# Patient Record
Sex: Male | Born: 1940 | Race: White | Hispanic: No | Marital: Single | Smoking: Smoker, current status unknown
Health system: Southern US, Community
[De-identification: ages and names within clinical notes are randomized; demographics above are authoritative.]

## PROBLEM LIST (undated history)

## (undated) DIAGNOSIS — S0990XA Unspecified injury of head, initial encounter: Secondary | ICD-10-CM

## (undated) DIAGNOSIS — F0390 Unspecified dementia without behavioral disturbance: Secondary | ICD-10-CM

## (undated) DIAGNOSIS — G219 Secondary parkinsonism, unspecified: Secondary | ICD-10-CM

## (undated) HISTORY — DX: Unspecified injury of head, initial encounter: S09.90XA

## (undated) HISTORY — DX: Unspecified dementia, unspecified severity, without behavioral disturbance, psychotic disturbance, mood disturbance, and anxiety: F03.90

## (undated) HISTORY — DX: Secondary parkinsonism, unspecified: G21.9

---

## 2000-08-27 ENCOUNTER — Inpatient Hospital Stay (HOSPITAL_COMMUNITY): Admission: EM | Admit: 2000-08-27 | Discharge: 2000-09-02 | Payer: Self-pay | Admitting: *Deleted

## 2000-08-27 ENCOUNTER — Encounter: Payer: Self-pay | Admitting: *Deleted

## 2001-10-21 ENCOUNTER — Ambulatory Visit (HOSPITAL_COMMUNITY): Admission: RE | Admit: 2001-10-21 | Discharge: 2001-10-21 | Payer: Self-pay | Admitting: Internal Medicine

## 2001-10-21 ENCOUNTER — Encounter: Payer: Self-pay | Admitting: Internal Medicine

## 2002-01-23 ENCOUNTER — Emergency Department (HOSPITAL_COMMUNITY): Admission: EM | Admit: 2002-01-23 | Discharge: 2002-01-23 | Payer: Self-pay | Admitting: Emergency Medicine

## 2002-01-23 ENCOUNTER — Encounter: Payer: Self-pay | Admitting: Emergency Medicine

## 2002-05-07 ENCOUNTER — Encounter: Payer: Self-pay | Admitting: Internal Medicine

## 2002-05-07 ENCOUNTER — Ambulatory Visit (HOSPITAL_COMMUNITY): Admission: RE | Admit: 2002-05-07 | Discharge: 2002-05-07 | Payer: Self-pay | Admitting: Internal Medicine

## 2002-08-24 ENCOUNTER — Ambulatory Visit (HOSPITAL_COMMUNITY): Admission: RE | Admit: 2002-08-24 | Discharge: 2002-08-24 | Payer: Self-pay | Admitting: Internal Medicine

## 2002-08-24 ENCOUNTER — Encounter: Payer: Self-pay | Admitting: Internal Medicine

## 2002-12-18 ENCOUNTER — Inpatient Hospital Stay: Admission: AD | Admit: 2002-12-18 | Discharge: 2009-06-09 | Payer: Self-pay | Admitting: Internal Medicine

## 2006-05-02 ENCOUNTER — Ambulatory Visit (HOSPITAL_COMMUNITY): Admission: RE | Admit: 2006-05-02 | Discharge: 2006-05-02 | Payer: Self-pay | Admitting: Internal Medicine

## 2006-09-10 ENCOUNTER — Ambulatory Visit (HOSPITAL_COMMUNITY): Admission: RE | Admit: 2006-09-10 | Discharge: 2006-09-10 | Payer: Self-pay | Admitting: Internal Medicine

## 2008-08-20 ENCOUNTER — Ambulatory Visit (HOSPITAL_COMMUNITY): Admission: RE | Admit: 2008-08-20 | Discharge: 2008-08-20 | Payer: Self-pay | Admitting: Internal Medicine

## 2009-06-09 ENCOUNTER — Inpatient Hospital Stay (HOSPITAL_COMMUNITY): Admission: EM | Admit: 2009-06-09 | Discharge: 2009-06-11 | Payer: Self-pay | Admitting: Emergency Medicine

## 2009-06-11 ENCOUNTER — Inpatient Hospital Stay
Admission: AD | Admit: 2009-06-11 | Discharge: 2014-03-28 | Disposition: A | Discharge status: Other Acute Inpatient Hospital | Payer: Medicare Other | Source: Home / Self Care | Attending: Internal Medicine | Admitting: Internal Medicine

## 2009-06-11 DIAGNOSIS — R05 Cough: Secondary | ICD-10-CM

## 2010-04-17 ENCOUNTER — Ambulatory Visit (HOSPITAL_COMMUNITY): Payer: Medicare Other | Attending: Internal Medicine

## 2010-04-17 DIAGNOSIS — M949 Disorder of cartilage, unspecified: Secondary | ICD-10-CM | POA: Insufficient documentation

## 2010-04-17 DIAGNOSIS — M7989 Other specified soft tissue disorders: Secondary | ICD-10-CM | POA: Insufficient documentation

## 2010-04-17 DIAGNOSIS — M899 Disorder of bone, unspecified: Secondary | ICD-10-CM | POA: Insufficient documentation

## 2010-04-17 DIAGNOSIS — M79609 Pain in unspecified limb: Secondary | ICD-10-CM | POA: Insufficient documentation

## 2010-04-18 LAB — BASIC METABOLIC PANEL
BUN: 11 mg/dL (ref 6–23)
Calcium: 8.9 mg/dL (ref 8.4–10.5)
Creatinine, Ser: 0.81 mg/dL (ref 0.4–1.5)
GFR calc non Af Amer: >60 mL/min (ref >60)

## 2010-04-18 LAB — LACTIC ACID, PLASMA: Lactic Acid, Venous: 1.5 mmol/L (ref 0.5–2.2)

## 2010-04-18 LAB — VALPROIC ACID LEVEL: Valproic Acid Lvl: 48 ug/mL — ABNORMAL LOW (ref 50.0–100.0)

## 2010-04-18 LAB — DIFFERENTIAL
Eosinophils Absolute: 0 10*3/uL (ref 0.0–0.7)
Lymphocytes Relative: 6 % — ABNORMAL LOW (ref 12–46)
Lymphs Abs: 0.5 10*3/uL — ABNORMAL LOW (ref 0.7–4.0)
Neutro Abs: 7.3 10*3/uL (ref 1.7–7.7)
Neutrophils Relative %: 85 % — ABNORMAL HIGH (ref 43–77)

## 2010-04-18 LAB — CULTURE, BLOOD (ROUTINE X 2)
Culture: NO GROWTH
Report Status: 5172011
Report Status: 5172011

## 2010-04-18 LAB — CBC
Platelets: 213 10*3/uL (ref 150–400)
WBC: 8.5 10*3/uL (ref 4.0–10.5)

## 2010-06-16 NOTE — Group Therapy Note (Signed)
[name], [name] NO.:  [id number]   MEDICAL RECORD NO.:  [id number]                  PATIENT TYPE:   LOCATION:                                       FACILITY:   PHYSICIAN:  Kingsley Callander. Ouida Sills, M.D.                  DATE OF BIRTH:   DATE OF PROCEDURE:  06/22/2003  DATE OF DISCHARGE:                                   PROGRESS NOTE   [name] continues to exhibit a marked tilting of his head to the right.  He  coughs intermittently, but has generally been able to eat well without  definite aspiration.   GENERAL:  He appears comfortable.  NEUROLOGIC:  Status is stable.  LUNGS:  Clear.  HEART:  Regular.  EXTREMITIES:  No edema.   IMPRESSION:  Status post closed head injury - Continue supportive care.  He  has been stable from a psychiatric standpoint on his current medication  regimen.  Baclofen has been tapered off.      ___________________________________________                                            Kingsley Callander. Ouida Sills, M.D.   ROF/MEDQ  D:  07/07/2003  T:  07/07/2003  Job:  [id number]

## 2010-06-16 NOTE — Group Therapy Note (Signed)
[name], [name]                ACCOUNT NO.:  [id number]   MEDICAL RECORD NO.:  [id number]          PATIENT TYPE:  ORB   LOCATION:  S140                          FACILITY:  [facility]   PHYSICIAN:  Kingsley Callander. Ouida Sills, MD       DATE OF BIRTH:  08-Nov-1940   DATE OF PROCEDURE:  05/21/2004  DATE OF DISCHARGE:                                   PROGRESS NOTE   NURSING HOME PROGRESS NOTE:  [name] overall status has remained fairly  stable.  He denies any increased problems with swallowing or coughing.  He  is breathing comfortably.   PHYSICAL EXAMINATION:  CHEST:  Clear.  HEART:  Regular.  NEURO:  Unchanged.   IMPRESSION:  Status post closed head injury.  Continue current medications.      ROF/MEDQ  D:  05/22/2004  T:  05/22/2004  Job:  [id number]

## 2010-06-16 NOTE — Group Therapy Note (Signed)
NAMEIVAL, PACER NO.:  [id number]   MEDICAL RECORD NO.:  [id number]                  PATIENT TYPE:   LOCATION:                                       FACILITY:   PHYSICIAN:  Kingsley Callander. Ouida Sills, M.D.                  DATE OF BIRTH:   DATE OF PROCEDURE:  04/27/2003  DATE OF DISCHARGE:                                   PROGRESS NOTE   [name] has had no major problems over the past month.  His cough is not  increasing.  His breathing has been stable.  His neck contracture is  unchanged.  Baclofen did not seem to help and has been tapered off.   PHYSICAL EXAMINATION:  Unchanged.   IMPRESSION:  Closed head injury.  The pharmacy consultant has advised  modification of his Depakote formulation.  This would seem to be fine and  will be changed.      ___________________________________________                                            Kingsley Callander. Ouida Sills, M.D.   ROF/MEDQ  D:  05/09/2003  T:  05/10/2003  Job:  [id number]

## 2010-06-16 NOTE — Group Therapy Note (Signed)
NAME:  [name], [name] NO.:  [id number]   MEDICAL RECORD NO.:  [id number]                  PATIENT TYPE:   LOCATION:                                       FACILITY:   PHYSICIAN:  Kingsley Callander. Ouida Sills, M.D.                  DATE OF BIRTH:   DATE OF PROCEDURE:  DATE OF DISCHARGE:                                   PROGRESS NOTE   [name] was seen on May 24.  This is a redictation.  Evidently, the  initial note is not able to be found.  Over the past month, [name] has had no  complications.  He continues to experience intermittent coughing.  He has  been able to swallow though, reasonably well.   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  His oropharynx is moist.  No thyromegaly.  LUNGS:  Clear.  HEART:  Regular with no murmurs.  ABDOMEN:  Nontender.  NEUROLOGICAL:  Unchanged.   IMPRESSION:  Closed head injury.   PLAN:  No medication changes at this point.  Continue Lexapro, Depakote and  Risperdal.      ___________________________________________                                            Kingsley Callander. Ouida Sills, M.D.   ROF/MEDQ  D:  08/05/2003  T:  08/05/2003  Job:  [id number]

## 2010-06-16 NOTE — Group Therapy Note (Signed)
NAMEKLYE, BESECKER NO.:  [id number]   MEDICAL RECORD NO.:  [id number]                  PATIENT TYPE:   LOCATION:                                       FACILITY:   PHYSICIAN:  Kingsley Callander. Ouida Sills, M.D.                  DATE OF BIRTH:   DATE OF PROCEDURE:  03/01/2003  DATE OF DISCHARGE:                                   PROGRESS NOTE   SUBJECTIVE:  [name] has had no problems over the past month.  He is not having  any increased difficulty with cough or swallowing reported.  He again has a  great deal of spasticity which does not really appear to have responded to  baclofen.   OBJECTIVE:  He has a marked tilting of his head to the right.  He is  breathing comfortable.  Chest is clear.  Heart is regular with no murmurs.  Neurologic at baseline.   ASSESSMENT:  1. Status post closed-head injury, stable.  2. History of depression.   PLAN:  Continue Lexapro, Depakote and Risperdal.      ___________________________________________                                            Kingsley Callander. Ouida Sills, M.D.   ROF/MEDQ  D:  03/04/2003  T:  03/04/2003  Job:  [id number]
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
## 2010-06-16 NOTE — Group Therapy Note (Signed)
[name], [name]                ACCOUNT NO.:  [id number]   MEDICAL RECORD NO.:  [id number]          PATIENT TYPE:  ORB   LOCATION:  S140                          FACILITY:  [facility]   PHYSICIAN:  Kingsley Callander. Ouida Sills, MD       DATE OF BIRTH:  06/27/1940   DATE OF PROCEDURE:  DATE OF DISCHARGE:                                 PROGRESS NOTE   [name] was seen today while he was having his breakfast.  As long as he  concentrates on eating and swallowing, he does not have much trouble  with choking and coughing.  If he tries to talk, he will often become  choked.  His head positioning remains a problem and is essentially  unchanged, despite physical therapy efforts has been very difficult to  achieve significant improvement in his head positioning   He is maintaining his weight and nutritional status.   He has been stable from a behavioral health standpoint.  We will  continue his current medical regimen without any medication changes  today.      Kingsley Callander. Ouida Sills, MD  Electronically Signed     ROF/MEDQ  D:  10/14/2007  T:  10/14/2007  Job:  [id number]

## 2010-06-16 NOTE — H&P (Signed)
[name], [name]                ACCOUNT NO.:  [id number]   MEDICAL RECORD NO.:  [id number]          PATIENT TYPE:  ORB   LOCATION:  S140                          FACILITY:  [facility]   PHYSICIAN:  Kingsley Callander. Ouida Sills, MD       DATE OF BIRTH:  1940-08-01   DATE OF ADMISSION:  03/26/2007  DATE OF DISCHARGE:  LH                              HISTORY & PHYSICAL   HISTORY OF PRESENT ILLNESS:  This patient is a 70 year old white male  with a history of a closed head injury in 1965, who has required long-  term for years.  He has had no significant overall changes in his health  over the past year.  He has been treated for depression with Celexa,  Depakote and Risperdal.  He previously had suicidal ideations, which  have now resolved.  He has previously had a VP shunt placed.  His  mobility has been limited.  He is in a wheelchair.   PAST MEDICAL HISTORY:  1. Closed head injury.  2. Depression.  3. Pneumonia.  4. Urinary tract infection.  5. VP shunt.   MEDICATIONS:  1. Colace 100 mg daily.  2. Centrum daily.  3. Celexa 30 mg daily.  4. Risperdal 2 mg b.i.d.  5. Depakote 125 mg capsules, 4 to be sprinkled in applesauce q. 12.   ALLERGIES:  PENICILLIN.   SOCIAL HISTORY:  He does not use alcohol or tobacco.   REVIEW OF SYSTEMS:  In the past he has had some difficulty with  swallowing and subsequent coughing.  He denies any increased problems  with these now.  He is not having any difficulty with breathing, chest  or abdominal pain.   PHYSICAL EXAMINATION:  Recent vital signs have been normal.  GENERAL:  Alert and sitting upright.  HEENT:  No scleral icterus.  Pharynx unremarkable.  NECK:  No JVD, thyromegaly or lymphadenopathy.  LUNGS:  Clear.  HEART:  Regular with no murmurs or gallops.  ABDOMEN:  Nontender.  No hepatosplenomegaly.  EXTREMITIES:  He has limited use of upper or lower extremities.  No  cyanosis, clubbing or edema.  NEUROLOGIC:  He has contracture of his head to the  right.  As stated, he  has limited motion in the extremities.  He has difficulty with  expressive aphasia.  SKIN:  Intact.   LABORATORY DATA:  Depakote level 61 August 2008.  He had a swallowing  study April 2008, which revealed a definite swallowing dysfunction, with  laryngeal penetration and aspiration.   IMPRESSION:  1. Closed head injury.  2. Depression.  Continue Celexa, Depakote and Risperdal.  3. Dysphagia.  This has been discussed with the family previously.      Oral feedings will be continued.  The plan will not be to utilize tube feeding.  1. History of pneumonia.  2. History of urinary tract infections.      Kingsley Callander. Ouida Sills, MD  Electronically Signed     ROF/MEDQ  D:  03/26/2007  T:  03/26/2007  Job:  [id number]

## 2010-06-16 NOTE — Group Therapy Note (Signed)
NAMEBRICESON, BROADWATER                ACCOUNT NO.:  [id number]   MEDICAL RECORD NO.:  [id number]          PATIENT TYPE:  ORB   LOCATION:  S140                          FACILITY:  [facility]   PHYSICIAN:  Kingsley Callander. Ouida Sills, MD       DATE OF BIRTH:  11/28/1940   DATE OF PROCEDURE:  08/25/2004  DATE OF DISCHARGE:                                   PROGRESS NOTE   HISTORY OF PRESENT ILLNESS:  [name] has had a fairly stable course recently.  He has had no signs of worsening coughing or increased difficulty with his  swallowing.  His behavior has been fairly well controlled.  He appears in  his baseline state.  Lungs are clear.  His heart rate is regular with no  murmurs.  Abdomen is nontender.  He has no peripheral edema.  His  musculoskeletal status is unchanged.   MEDICATIONS:  1.  Colace.  2.  Theragran.  3.  Celexa.  4.  Risperdal.  5.  Depakote.  6.  Levsin.   IMPRESSION:  Closed head injury, stable.  No medication changes at this  time.       ROF/MEDQ  D:  09/04/2004  T:  09/04/2004  Job:  [id number]

## 2010-06-16 NOTE — Group Therapy Note (Signed)
[name], LUTZE NO.:  [id number]   MEDICAL RECORD NO.:  [id number]                  PATIENT TYPE:   LOCATION:                                       FACILITY:   PHYSICIAN:  Kingsley Callander. Ouida Sills, M.D.                  DATE OF BIRTH:   DATE OF PROCEDURE:  05/29/2003  DATE OF DISCHARGE:                                   PROGRESS NOTE   HISTORY OF PRESENT ILLNESS:  [name] has had no changes in his neurologic status  over the past month.  Despite physical therapy efforts, his spasticity with  his neck is minimally changed.  He seems to be swallowing well and breathing  well now.  He does cough frequently.  Lungs clear.  Heart regular.  No  peripheral edema.  Neurologically stable.   IMPRESSION:  Status post closed head injury.   PLAN:  Continue current medications.      ___________________________________________                                            Kingsley Callander. Ouida Sills, M.D.   ROF/MEDQ  D:  07/07/2003  T:  07/07/2003  Job:  [id number]

## 2011-02-08 ENCOUNTER — Ambulatory Visit (HOSPITAL_COMMUNITY)
Admit: 2011-02-08 | Discharge: 2011-02-08 | Disposition: A | Discharge status: Skilled Nursing Facility | Payer: Medicare Other | Source: Skilled Nursing Facility | Attending: Internal Medicine | Admitting: Internal Medicine

## 2011-02-08 DIAGNOSIS — R05 Cough: Secondary | ICD-10-CM | POA: Insufficient documentation

## 2011-02-08 DIAGNOSIS — R059 Cough, unspecified: Secondary | ICD-10-CM | POA: Insufficient documentation

## 2011-02-08 DIAGNOSIS — R0602 Shortness of breath: Secondary | ICD-10-CM | POA: Insufficient documentation

## 2011-02-12 NOTE — Progress Notes (Signed)
NAMEJACQUEL, MCCAMISH NO.:  [id number]  MEDICAL RECORD NO.:  [id number]  LOCATION:                                 FACILITY:  PHYSICIAN:  Kingsley Callander. Ouida Sills, MD       DATE OF BIRTH:  Aug 22, 1940  DATE OF PROCEDURE: DATE OF DISCHARGE:                                PROGRESS NOTE   [name] developed a fever over 101 yesterday.  He had cough and runny nose.  Chest x-ray revealed atelectasis in the right lung, but no evidence of infiltrate consistent with pneumonia.  His roommate currently has pneumonia.  Influenza studies were negative.  HEENT:  Pharynx is moist. LUNGS:  Clear. HEART:  Regular with no murmurs. ABDOMEN:  Nontender.  IMPRESSION/PLAN:  Upper respiratory infection with no definite evidence of pneumonia at this point.  He will be treated with Levaquin 500 mg daily for 1 week.     Kingsley Callander. Ouida Sills, MD     ROF/MEDQ  D:  02/09/2011  T:  02/09/2011  Job:  [id number]

## 2011-03-18 DIAGNOSIS — S0990XA Unspecified injury of head, initial encounter: Secondary | ICD-10-CM | POA: Diagnosis present

## 2011-04-30 ENCOUNTER — Ambulatory Visit (HOSPITAL_COMMUNITY): Payer: Medicare Other | Attending: Internal Medicine

## 2011-04-30 DIAGNOSIS — M4120 Other idiopathic scoliosis, site unspecified: Secondary | ICD-10-CM | POA: Insufficient documentation

## 2011-04-30 DIAGNOSIS — R0989 Other specified symptoms and signs involving the circulatory and respiratory systems: Secondary | ICD-10-CM | POA: Insufficient documentation

## 2011-07-13 NOTE — Progress Notes (Signed)
NAMERENNIE, ROUCH NO.:  [id number]  MEDICAL RECORD NO.:  [id number]  LOCATION:                                 FACILITY:  PHYSICIAN:  Ronnie Callander. Ouida Sills, MD       DATE OF BIRTH:  May 20, 1940  DATE OF PROCEDURE:  07/13/2011 DATE OF DISCHARGE:                                PROGRESS NOTE   SUBJECTIVE:  [name] has been noticed by the nursing staff to have drooping of the right side of his face.  He has otherwise had no observed neurological changes.  OBJECTIVE:  HEENT:  He was found to have loss of his nasolabial fold on the right side.  He had difficulty fully closing his right eye.  He has been able to swallow without changes.  He is able to protrude his tongue. EXTREMITIES:  There have been no changes in his upper or lower extremities.  IMPRESSION/PLAN:  Right Bell palsy.  He will be treated with a course of acyclovir and a prednisone taper.  His eye will be patched at night and lubricating ointment will be used throughout the day.     Ronnie Callander. Ouida Sills, MD     ROF/MEDQ  D:  07/13/2011  T:  07/13/2011  Job:  [id number]

## 2011-08-13 NOTE — Progress Notes (Unsigned)
[name], [name] NO.:  [id number]  MEDICAL RECORD NO.:  [id number]  LOCATION:                                 FACILITY:  PHYSICIAN:  Kingsley Callander. Ouida Sills, MD       DATE OF BIRTH:  11/27/1940  DATE OF PROCEDURE:  08/12/2011 DATE OF DISCHARGE:                                PROGRESS NOTE   [name] was seen for re-evaluation of his right Bell's palsy.  He has been treated with acyclovir and prednisone.  His right facial weakness appears to be improving.  He has less of a droop of his right cheek and lip.  He still has some difficulty fully closing his right eye.  He has otherwise remained unchanged neurologically.  IMPRESSIONS/PLAN:  Not Bell's palsy.  Continue observation.  Hopefully, his right facial changes will completely resolve in the near future.     Kingsley Callander. Ouida Sills, MD     ROF/MEDQ  D:  08/12/2011  T:  08/12/2011  Job:  [id number]

## 2012-04-10 NOTE — Progress Notes (Unsigned)
NAMEALYAAN, BUDZYNSKI NO.:  [id number]  MEDICAL RECORD NO.:  [id number]  LOCATION:                                 FACILITY:  PHYSICIAN:  Kingsley Callander. Ouida Sills, MD       DATE OF BIRTH:  1940/05/05  DATE OF PROCEDURE:  03/24/2012 DATE OF DISCHARGE:                                PROGRESS NOTE   HISTORY:  [name] developed an abnormality with his left thumb.  The nurse called about redness and possible infection.  On exam, he was found to have an abscess on the end of his left thumb after a Betadine prep and ethyl chloride cooling.  This was incised and drained of pus.  A dressing was applied.  The nursing staff will continue with dressing changes.  He will be treated with a course of antibiotic therapy for 10 days.     Kingsley Callander. Ouida Sills, MD     ROF/MEDQ  D:  04/09/2012  T:  04/10/2012  Job:  [id number]

## 2012-06-16 NOTE — Progress Notes (Unsigned)
NAMEISSACC, MERLO NO.:  [id number]  MEDICAL RECORD NO.:  [id number]  LOCATION:                                 FACILITY:  PHYSICIAN:  Kingsley Callander. Ouida Sills, MD       DATE OF BIRTH:  04/22/1940  DATE OF PROCEDURE:  06/12/2012 DATE OF DISCHARGE:                                PROGRESS NOTE   __________ developed another small abscess on the tip of his left thumb. There was visible pus under the skin measuring approximately 0.5 cm in diameter.  This was prepped with Betadine and then incised and drained with a #15 blade.  This wound will be treated with soaks b.i.d. and b.i.d. dressing changes.  His condition has otherwise been stable.  No medication changes have been made.     Kingsley Callander. Ouida Sills, MD     ROF/MEDQ  D:  06/13/2012  T:  06/13/2012  Job:  [id number]

## 2012-08-19 ENCOUNTER — Ambulatory Visit (HOSPITAL_COMMUNITY)
Admission: AD | Admit: 2012-08-19 | Discharge: 2012-08-19 | Disposition: A | Discharge status: Home / Self Care | Payer: Medicare Other | Attending: Family Medicine | Admitting: Family Medicine

## 2012-08-19 ENCOUNTER — Ambulatory Visit (HOSPITAL_COMMUNITY): Payer: Medicare Other | Attending: Internal Medicine

## 2012-08-19 DIAGNOSIS — R062 Wheezing: Secondary | ICD-10-CM | POA: Insufficient documentation

## 2012-08-19 DIAGNOSIS — M4120 Other idiopathic scoliosis, site unspecified: Secondary | ICD-10-CM | POA: Insufficient documentation

## 2012-08-19 DIAGNOSIS — R059 Cough, unspecified: Secondary | ICD-10-CM | POA: Insufficient documentation

## 2012-08-19 DIAGNOSIS — R05 Cough: Secondary | ICD-10-CM | POA: Insufficient documentation

## 2012-08-19 DIAGNOSIS — M4000 Postural kyphosis, site unspecified: Secondary | ICD-10-CM | POA: Insufficient documentation

## 2012-09-11 ENCOUNTER — Ambulatory Visit (HOSPITAL_COMMUNITY)
Admit: 2012-09-11 | Discharge: 2012-09-11 | Disposition: A | Discharge status: Home / Self Care | Payer: Medicare Other | Attending: Internal Medicine | Admitting: Internal Medicine

## 2012-09-11 ENCOUNTER — Ambulatory Visit (HOSPITAL_COMMUNITY)
Admit: 2012-09-11 | Discharge: 2012-09-11 | Disposition: A | Discharge status: Home / Self Care | Payer: Medicare Other | Source: Ambulatory Visit | Attending: Internal Medicine | Admitting: Internal Medicine

## 2012-09-11 DIAGNOSIS — R1310 Dysphagia, unspecified: Secondary | ICD-10-CM | POA: Insufficient documentation

## 2012-09-11 DIAGNOSIS — R1313 Dysphagia, pharyngeal phase: Secondary | ICD-10-CM | POA: Insufficient documentation

## 2012-09-11 DIAGNOSIS — IMO0001 Reserved for inherently not codable concepts without codable children: Secondary | ICD-10-CM | POA: Insufficient documentation
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
## 2013-04-07 ENCOUNTER — Encounter: Payer: Self-pay | Admitting: *Deleted

## 2013-04-28 NOTE — Progress Notes (Unsigned)
NAMLazarus Salines:  [name], [name]                ACCOUNT NO.:  [id number]  MEDICAL RECORD NO.:  [id number]  LOCATION:                                 FACILITY:  PHYSICIAN:  Kingsley Callanderoy O. Ouida SillsFagan, MD       DATE OF BIRTH:  [birth year]  DATE OF PROCEDURE:  04/22/2013 DATE OF DISCHARGE:                                PROGRESS NOTE   SUBJECTIVE:  [name] is alert and sitting up in his wheelchair.  He is swallowing reasonably well.  He continues to experience intermittent cough.  He has been stable from a neurologic standpoint.  OBJECTIVE:  LUNGS:  Clear. HEART:  Regular with no murmurs. EXTREMITIES:  No edema.  IMPRESSION/PLAN:  Closed head injury.  He has mobility limitations that impairs ability to walk.  Use a walker or crutches.  He has to have a caregiver propel him in a wheelchair due to his quadriplegia.  An order has been placed for a new wheelchair.  Psychological status has been stable.  He continues citalopram, risperidone, and Depakote.     Kingsley Callanderoy O. Ouida SillsFagan, MD     ROF/MEDQ  D:  04/24/2013  T:  04/24/2013  Job:  [id number]

## 2013-05-31 ENCOUNTER — Inpatient Hospital Stay (HOSPITAL_COMMUNITY): Payer: Medicare Other

## 2013-05-31 ENCOUNTER — Ambulatory Visit (HOSPITAL_COMMUNITY): Payer: Medicare Other | Attending: Internal Medicine

## 2013-05-31 DIAGNOSIS — R0602 Shortness of breath: Secondary | ICD-10-CM | POA: Insufficient documentation

## 2013-06-29 ENCOUNTER — Ambulatory Visit (HOSPITAL_COMMUNITY): Payer: Medicare Other | Attending: Internal Medicine

## 2013-06-29 DIAGNOSIS — M7989 Other specified soft tissue disorders: Secondary | ICD-10-CM | POA: Insufficient documentation

## 2013-06-29 DIAGNOSIS — M79609 Pain in unspecified limb: Secondary | ICD-10-CM | POA: Insufficient documentation

## 2013-09-23 NOTE — Progress Notes (Unsigned)
NAMEJASTIN, FORE NO.:  [id number]  MEDICAL RECORD NO.:  [id number]  LOCATION:                                 FACILITY:  PHYSICIAN:  Kingsley Callander. Ouida Sills, MD            DATE OF BIRTH:  DATE OF PROCEDURE:  09/18/2013 DATE OF DISCHARGE:                                PROGRESS NOTE   SUBJECTIVE:  [name] was rechecked at the nursing home.  He has had no further problems with his thumb since his wire has been removed.  He has had modification of his psychiatric medications.  The nurses report no problems from this change.  OBJECTIVE:  GENERAL:  He is alert and comfortable appearing. HEAD AND NECK:  There is no change with his head and neck. LUNGS:  Clear. HEART:  Regular with no murmurs.  ABDOMEN:  Soft and nontender with no palpable organomegaly. EXTREMITIES:  Reveal no edema. NEURO:  Unchanged.  IMPRESSION/PLAN:  Closed head injury.  His risperidone dose has been gradually tapered.  He has tolerated this well.  We will continue to observe further since his most recent dosage reduction.  Depakote is unchanged.     Kingsley Callander. Ouida Sills, MD     ROF/MEDQ  D:  09/23/2013  T:  09/23/2013  Job:  [id number]

## 2013-11-24 ENCOUNTER — Encounter: Payer: Self-pay | Admitting: *Deleted

## 2013-12-13 ENCOUNTER — Inpatient Hospital Stay (HOSPITAL_COMMUNITY): Payer: Medicare Other | Attending: Internal Medicine

## 2014-03-28 ENCOUNTER — Emergency Department (HOSPITAL_COMMUNITY): Payer: Medicare Other

## 2014-03-28 ENCOUNTER — Encounter (HOSPITAL_COMMUNITY): Payer: Self-pay | Admitting: Emergency Medicine

## 2014-03-28 ENCOUNTER — Inpatient Hospital Stay (HOSPITAL_COMMUNITY)
Admission: EM | Admit: 2014-03-28 | Discharge: 2014-04-30 | DRG: 871 | Disposition: E | Payer: Medicare Other | Attending: Internal Medicine | Admitting: Internal Medicine

## 2014-03-28 DIAGNOSIS — D649 Anemia, unspecified: Secondary | ICD-10-CM | POA: Diagnosis present

## 2014-03-28 DIAGNOSIS — Z87820 Personal history of traumatic brain injury: Secondary | ICD-10-CM

## 2014-03-28 DIAGNOSIS — F0390 Unspecified dementia without behavioral disturbance: Secondary | ICD-10-CM | POA: Diagnosis present

## 2014-03-28 DIAGNOSIS — G20 Parkinson's disease: Secondary | ICD-10-CM | POA: Diagnosis present

## 2014-03-28 DIAGNOSIS — Z515 Encounter for palliative care: Secondary | ICD-10-CM

## 2014-03-28 DIAGNOSIS — Z880 Allergy status to penicillin: Secondary | ICD-10-CM | POA: Diagnosis not present

## 2014-03-28 DIAGNOSIS — J9691 Respiratory failure, unspecified with hypoxia: Secondary | ICD-10-CM | POA: Diagnosis present

## 2014-03-28 DIAGNOSIS — A419 Sepsis, unspecified organism: Secondary | ICD-10-CM | POA: Diagnosis present

## 2014-03-28 DIAGNOSIS — R0682 Tachypnea, not elsewhere classified: Secondary | ICD-10-CM

## 2014-03-28 DIAGNOSIS — F17200 Nicotine dependence, unspecified, uncomplicated: Secondary | ICD-10-CM | POA: Diagnosis present

## 2014-03-28 DIAGNOSIS — N390 Urinary tract infection, site not specified: Secondary | ICD-10-CM | POA: Diagnosis present

## 2014-03-28 DIAGNOSIS — Z6823 Body mass index (BMI) 23.0-23.9, adult: Secondary | ICD-10-CM | POA: Diagnosis not present

## 2014-03-28 DIAGNOSIS — R7989 Other specified abnormal findings of blood chemistry: Secondary | ICD-10-CM

## 2014-03-28 DIAGNOSIS — E46 Unspecified protein-calorie malnutrition: Secondary | ICD-10-CM | POA: Diagnosis present

## 2014-03-28 DIAGNOSIS — R0602 Shortness of breath: Secondary | ICD-10-CM

## 2014-03-28 DIAGNOSIS — J189 Pneumonia, unspecified organism: Secondary | ICD-10-CM | POA: Diagnosis present

## 2014-03-28 DIAGNOSIS — Y95 Nosocomial condition: Secondary | ICD-10-CM | POA: Diagnosis present

## 2014-03-28 DIAGNOSIS — Z66 Do not resuscitate: Secondary | ICD-10-CM | POA: Diagnosis present

## 2014-03-28 LAB — URINALYSIS, ROUTINE W REFLEX MICROSCOPIC
Glucose, UA: NEGATIVE mg/dL
Ketones, ur: 15 mg/dL — AB
Nitrite: POSITIVE — AB
Protein, ur: 30 mg/dL — AB
Specific Gravity, Urine: >1.03 — ABNORMAL HIGH (ref 1.005–1.030)
Urobilinogen, UA: 1 mg/dL (ref 0.0–1.0)
pH: 5.5 (ref 5.0–8.0)

## 2014-03-28 LAB — COMPREHENSIVE METABOLIC PANEL WITH GFR
ALT: 19 U/L (ref 0–53)
AST: 28 U/L (ref 0–37)
Albumin: 2.4 g/dL — ABNORMAL LOW (ref 3.5–5.2)
Alkaline Phosphatase: 68 U/L (ref 39–117)
Anion gap: 4 — ABNORMAL LOW (ref 5–15)
BUN: 38 mg/dL — ABNORMAL HIGH (ref 6–23)
CO2: 25 mmol/L (ref 19–32)
Calcium: 8.2 mg/dL — ABNORMAL LOW (ref 8.4–10.5)
Chloride: 111 mmol/L (ref 96–112)
Creatinine, Ser: 0.94 mg/dL (ref 0.50–1.35)
GFR calc Af Amer: >90 mL/min
GFR calc non Af Amer: 82 mL/min — ABNORMAL LOW
Glucose, Bld: 121 mg/dL — ABNORMAL HIGH (ref 70–99)
Potassium: 4.4 mmol/L (ref 3.5–5.1)
Sodium: 140 mmol/L (ref 135–145)
Total Bilirubin: 0.4 mg/dL (ref 0.3–1.2)
Total Protein: 8 g/dL (ref 6.0–8.3)

## 2014-03-28 LAB — CBC WITH DIFFERENTIAL/PLATELET
Basophils Absolute: 0 K/uL (ref 0.0–0.1)
Basophils Relative: 0 % (ref 0–1)
Eosinophils Absolute: 0 K/uL (ref 0.0–0.7)
Eosinophils Relative: 0 % (ref 0–5)
HCT: 36.3 % — ABNORMAL LOW (ref 39.0–52.0)
Hemoglobin: 11.8 g/dL — ABNORMAL LOW (ref 13.0–17.0)
Lymphocytes Relative: 5 % — ABNORMAL LOW (ref 12–46)
Lymphs Abs: 0.8 K/uL (ref 0.7–4.0)
MCH: 30.6 pg (ref 26.0–34.0)
MCHC: 32.5 g/dL (ref 30.0–36.0)
MCV: 94.3 fL (ref 78.0–100.0)
Monocytes Absolute: 0.9 K/uL (ref 0.1–1.0)
Monocytes Relative: 6 % (ref 3–12)
Neutro Abs: 14.5 K/uL — ABNORMAL HIGH (ref 1.7–7.7)
Neutrophils Relative %: 89 % — ABNORMAL HIGH (ref 43–77)
Platelets: 358 K/uL (ref 150–400)
RBC: 3.85 MIL/uL — ABNORMAL LOW (ref 4.22–5.81)
RDW: 13.8 % (ref 11.5–15.5)
WBC: 16.2 K/uL — ABNORMAL HIGH (ref 4.0–10.5)

## 2014-03-28 LAB — TROPONIN I: Troponin I: <0.03 ng/mL (ref <0.031)

## 2014-03-28 LAB — URINE MICROSCOPIC-ADD ON

## 2014-03-28 LAB — I-STAT CG4 LACTIC ACID, ED: Lactic Acid, Venous: 1.36 mmol/L (ref 0.5–2.0)

## 2014-03-28 LAB — BRAIN NATRIURETIC PEPTIDE: B Natriuretic Peptide: 32 pg/mL (ref 0.0–100.0)

## 2014-03-28 LAB — MRSA PCR SCREENING: MRSA by PCR: NEGATIVE

## 2014-03-28 MED ORDER — SODIUM CHLORIDE 0.9 % IV SOLN
1000.0000 mL | INTRAVENOUS | Status: DC
  Administered 2014-03-28 (×3): 1000 mL via INTRAVENOUS

## 2014-03-28 MED ORDER — SODIUM CHLORIDE 0.9 % IV SOLN
INTRAVENOUS | Status: AC
  Administered 2014-03-28: 05:00:00 via INTRAVENOUS

## 2014-03-28 MED ORDER — VANCOMYCIN HCL IN DEXTROSE 1-5 GM/200ML-% IV SOLN
1000.0000 mg | Freq: Two times a day (BID) | INTRAVENOUS | Status: DC
  Administered 2014-03-28 – 2014-03-29 (×3): 1000 mg via INTRAVENOUS
  Filled 2014-03-28 (×6): qty 200

## 2014-03-28 MED ORDER — LEVOFLOXACIN IN D5W 750 MG/150ML IV SOLN
750.0000 mg | Freq: Once | INTRAVENOUS | Status: AC
  Administered 2014-03-28: 750 mg via INTRAVENOUS
  Filled 2014-03-28: qty 150

## 2014-03-28 MED ORDER — SODIUM CHLORIDE 0.9 % IV SOLN: INTRAVENOUS | Status: AC

## 2014-03-28 MED ORDER — DEXTROSE 5 % IV SOLN
2.0000 g | Freq: Once | INTRAVENOUS | Status: AC
  Administered 2014-03-28: 2 g via INTRAVENOUS
  Filled 2014-03-28: qty 2

## 2014-03-28 MED ORDER — ACETAMINOPHEN 325 MG PO TABS
650.0000 mg | ORAL_TABLET | Freq: Four times a day (QID) | ORAL | Status: DC | PRN
  Administered 2014-03-29: 650 mg via ORAL

## 2014-03-28 MED ORDER — ENOXAPARIN SODIUM 30 MG/0.3ML ~~LOC~~ SOLN
30.0000 mg | SUBCUTANEOUS | Status: DC
  Administered 2014-03-28: 30 mg via SUBCUTANEOUS
  Filled 2014-03-28: qty 0.3

## 2014-03-28 MED ORDER — KETOROLAC TROMETHAMINE 30 MG/ML IJ SOLN
30.0000 mg | Freq: Once | INTRAMUSCULAR | Status: AC
  Administered 2014-03-28: 30 mg via INTRAVENOUS
  Filled 2014-03-28: qty 1

## 2014-03-28 MED ORDER — SODIUM CHLORIDE 0.9 % IV BOLUS (SEPSIS)
1000.0000 mL | Freq: Once | INTRAVENOUS | Status: AC
  Administered 2014-03-28: 1000 mL via INTRAVENOUS

## 2014-03-28 MED ORDER — CETYLPYRIDINIUM CHLORIDE 0.05 % MT LIQD
7.0000 mL | Freq: Two times a day (BID) | OROMUCOSAL | Status: DC
  Administered 2014-03-28 – 2014-03-30 (×5): 7 mL via OROMUCOSAL

## 2014-03-28 MED ORDER — ENOXAPARIN SODIUM 40 MG/0.4ML ~~LOC~~ SOLN
40.0000 mg | SUBCUTANEOUS | Status: DC
  Administered 2014-03-29: 40 mg via SUBCUTANEOUS
  Filled 2014-03-28: qty 0.4

## 2014-03-28 MED ORDER — SODIUM CHLORIDE 0.9 % IV SOLN
1000.0000 mL | Freq: Once | INTRAVENOUS | Status: AC
  Administered 2014-03-28: 1000 mL via INTRAVENOUS

## 2014-03-28 MED ORDER — LEVOFLOXACIN IN D5W 750 MG/150ML IV SOLN
750.0000 mg | INTRAVENOUS | Status: DC
  Administered 2014-03-29 – 2014-03-30 (×2): 750 mg via INTRAVENOUS
  Filled 2014-03-28 (×2): qty 150

## 2014-03-28 MED ORDER — AZTREONAM 1 G IJ SOLR: 1.0000 g | Freq: Once | INTRAMUSCULAR | Status: DC

## 2014-03-28 MED ORDER — RISPERIDONE 0.5 MG PO TABS
0.5000 mg | ORAL_TABLET | Freq: Two times a day (BID) | ORAL | Status: DC
  Administered 2014-03-28 – 2014-03-29 (×2): 0.5 mg via ORAL
  Filled 2014-03-28: qty 0.5
  Filled 2014-03-28: qty 1
  Filled 2014-03-28 (×2): qty 0.5
  Filled 2014-03-28: qty 1

## 2014-03-28 MED ORDER — ACETAMINOPHEN 650 MG RE SUPP
650.0000 mg | Freq: Once | RECTAL | Status: AC
  Administered 2014-03-28: 650 mg via RECTAL
  Filled 2014-03-28: qty 1

## 2014-03-28 MED ORDER — PNEUMOCOCCAL VAC POLYVALENT 25 MCG/0.5ML IJ INJ
0.5000 mL | INJECTION | INTRAMUSCULAR | Status: AC
  Administered 2014-03-29: 0.5 mL via INTRAMUSCULAR
  Filled 2014-03-28: qty 0.5

## 2014-03-28 MED ORDER — VANCOMYCIN HCL IN DEXTROSE 1-5 GM/200ML-% IV SOLN
1000.0000 mg | Freq: Once | INTRAVENOUS | Status: AC
  Administered 2014-03-28: 1000 mg via INTRAVENOUS
  Filled 2014-03-28: qty 200

## 2014-03-28 MED ORDER — VANCOMYCIN HCL IN DEXTROSE 1-5 GM/200ML-% IV SOLN
1000.0000 mg | Freq: Once | INTRAVENOUS | Status: DC
  Filled 2014-03-28: qty 200

## 2014-03-28 MED ORDER — DEXTROSE 5 % IV SOLN
1.0000 g | Freq: Three times a day (TID) | INTRAVENOUS | Status: DC
  Administered 2014-03-28 – 2014-03-29 (×4): 1 g via INTRAVENOUS
  Filled 2014-03-28 (×9): qty 1

## 2014-03-28 MED ORDER — ACETAMINOPHEN 650 MG RE SUPP
650.0000 mg | Freq: Four times a day (QID) | RECTAL | Status: DC | PRN
  Administered 2014-03-28 – 2014-03-30 (×3): 650 mg via RECTAL
  Filled 2014-03-28 (×4): qty 1

## 2014-03-28 MED ORDER — SODIUM CHLORIDE 0.9 % IJ SOLN
3.0000 mL | Freq: Two times a day (BID) | INTRAMUSCULAR | Status: DC
  Administered 2014-03-28 – 2014-03-29 (×3): 3 mL via INTRAVENOUS

## 2014-03-28 NOTE — Progress Notes (Addendum)
ANTIBIOTIC CONSULT NOTE-Preliminary  Pharmacy Consult for Aztreonam, Vancomycin and Levofloxacin Indication: Sepsis  Allergies  Allergen Reactions  . Penicillins     Patient Measurements: Height: 6\' 1"[id number]$  (185.4 cm) Weight: 215 lb (97.523 kg) IBW/kg (Calculated) : 79.9  Vital Signs: Temp: 103.2 F (39.6 C) (02/28 0325) Temp Source: Core (Comment) (02/28 0325) BP: 112/78 mmHg (02/28 0304) Pulse Rate: 136 (02/28 0315)  Labs:  Recent Labs  2014-09-29 0217  WBC 16.2*  HGB 11.8*  PLT 358  CREATININE 0.94    Estimated Creatinine Clearance: 88.6 mL/min (by C-G formula based on Cr of 0.94).  No results for input(s): VANCOTROUGH, VANCOPEAK, VANCORANDOM, GENTTROUGH, GENTPEAK, GENTRANDOM, TOBRATROUGH, TOBRAPEAK, TOBRARND, AMIKACINPEAK, AMIKACINTROU, AMIKACIN in the last 72 hours.   Microbiology: No results found for this or any previous visit (from the past 720 hour(s)).  Medical History: Past Medical History  Diagnosis Date  . Head injury, unspecified   . Secondary parkinsonism   . Dementia, unspecified, without behavioral disturbance     Medications:  Vancomycin 1 Gm IV in the ED at 0300 2/28  Assessment: [age] male nursing home resident seen in the ED with respiratory distress, increased heart rate, fever and elevated WBCs. Pt to be started on empiric antibiotics for presumed sepsis.  Goal of Therapy:  Eradicate infection  Plan:  Preliminary review of pertinent patient information completed.  Protocol will be initiated with one-time doses of aztreonam 2 Gm IV and levofloxacin 750 mg IV.  Jeani HawkingAnnie Penn clinical pharmacist will complete review during morning rounds to assess patient and finalize treatment regimen.  Arelia SneddonMason, Yatzari Jonsson Anne, American Recovery CenterRPH Mar 01, 2014,3:30 AM

## 2014-03-28 NOTE — Progress Notes (Signed)
ANTIBIOTIC CONSULT NOTE - INITIAL  Pharmacy Consult for Vancomycin, Aztreonam, Levaquin Indication: rule out sepsis  Allergies  Allergen Reactions  . Penicillins Other (See Comments)    unknown   Patient Measurements: Height: 6\' 1"  (185.4 cm) Weight: 215 lb (97.523 kg) IBW/kg (Calculated) : 79.9  Vital Signs: Temp: 98 F (36.7 C) (02/28 0809) Temp Source: Oral (02/28 0809) BP: 100/71 mmHg (02/28 1200) Pulse Rate: 90 (02/28 1200) Intake/Output from previous day: 02/27 0701 - 02/28 0700 In: -  Out: 15 [Urine:15] Intake/Output from this shift:    Labs:  Recent Labs  03/20/2014 0217  WBC 16.2*  HGB 11.8*  PLT 358  CREATININE 0.94   Estimated Creatinine Clearance: 88.6 mL/min (by C-G formula based on Cr of 0.94). No results for input(s): VANCOTROUGH, VANCOPEAK, VANCORANDOM, GENTTROUGH, GENTPEAK, GENTRANDOM, TOBRATROUGH, TOBRAPEAK, TOBRARND, AMIKACINPEAK, AMIKACINTROU, AMIKACIN in the last 72 hours.   Microbiology: Recent Results (from the past 720 hour(s))  Culture, blood (routine x 2)     Status: None (Preliminary result)   Collection Time: 03/09/2014  3:04 AM  Result Value Ref Range Status   Specimen Description LEFT ANTECUBITAL  Final   Special Requests BOTTLES DRAWN AEROBIC ONLY 8CC  Final   Culture NO GROWTH <24 HRS  Final   Report Status PENDING  Incomplete  Culture, blood (routine x 2)     Status: None (Preliminary result)   Collection Time: 03/25/2014  3:10 AM  Result Value Ref Range Status   Specimen Description BLOOD LEFT ARM  Final   Special Requests BOTTLES DRAWN AEROBIC AND ANAEROBIC 6CC  Final   Culture NO GROWTH <24 HRS  Final   Report Status PENDING  Incomplete    Medical History: Past Medical History  Diagnosis Date  . Head injury, unspecified   . Secondary parkinsonism   . Dementia, unspecified, without behavioral disturbance    Levaquin 2/28 >> Vancomycin 2/28 >> Aztreonam 2/28 >>  Assessment: 74yo male with good renal fxn.  Estimated  Creatinine Clearance: 88.6 mL/min (by C-G formula based on Cr of 0.94).  Asked to initiate ABX for suspected pna / sepsis.  Initial doses given in ED.  Goal of Therapy:  Vancomycin trough level 15-20 mcg/ml Eradicate infection.  Plan:  Levaquin 750mg  IV q24hrs Aztreonam 1gm IV q8h Vancomycin 1000mg  IV q12hrs Check trough at steady state Monitor labs, renal fxn, and cultures Deescalate ABX when appropriate  Valrie HartHall, Christ Fullenwider A 03/09/2014,1:28 PM

## 2014-03-28 NOTE — ED Provider Notes (Addendum)
CSN: [id number]     Arrival date & time 03/03/2014  0119 History   First MD Initiated Contact with Patient 03/02/2014 0135     Chief Complaint  Patient presents with  . Shortness of Breath     (Consider location/radiation/quality/duration/timing/severity/associated sxs/prior Treatment) Patient is a 74 y.o. male presenting with shortness of breath. The history is provided by the nursing home. The history is limited by the condition of the patient (Severe dementia).  Shortness of Breath He was transferred from [facility] because of sudden change in respiratory status. He is bedridden with sugar, history of head injury, and secondary parkinsonism and is it is always turned to the right. Nursing home reports a sudden change in his status including tachypnea and decreased oxygen saturations. No other history is available. He is DO NOT RESUSCITATE.  Past Medical History  Diagnosis Date  . Head injury, unspecified   . Secondary parkinsonism   . Dementia, unspecified, without behavioral disturbance    History reviewed. No pertinent past surgical history. No family history on file. History  Substance Use Topics  . Smoking status: Smoker, Current Status Unknown  . Smokeless tobacco: Not on file  . Alcohol Use: No    Review of Systems  Unable to perform ROS: Dementia  Respiratory: Positive for shortness of breath.       Allergies  Penicillins  Home Medications   Prior to Admission medications   Medication Sig Start Date End Date Taking? Authorizing Provider  acetaminophen (TYLENOL) 325 MG tablet Take 650 mg by mouth every 4 (four) hours as needed for mild pain or fever (over 100).    Historical Provider, MD  acetaminophen (TYLENOL) 650 MG suppository Place 650 mg rectally every 4 (four) hours as needed for mild pain or fever (over 100 if unable to take orally.).    Historical Provider, MD  citalopram (CELEXA) 20 MG tablet Take 20 mg by mouth daily. For depression    Historical  Provider, MD  divalproex (DEPAKOTE SPRINKLES) 125 MG capsule Take 125 mg by mouth 2 (two) times daily. Give with applesauce and give by mouth every 12 hours for closed head injuries w/psychosis.    Historical Provider, MD  docusate sodium (COLACE) 100 MG capsule Take 100 mg by mouth daily. For constipation    Historical Provider, MD  Multiple Vitamins-Minerals (HCA SUPER THERAVITE-M PO) Take 1 tablet by mouth daily. For health maintenance    Historical Provider, MD  promethazine (PHENERGAN) 12.5 MG tablet Take 12.5 mg by mouth every 6 (six) hours as needed for nausea or vomiting.    Historical Provider, MD  risperiDONE (RISPERDAL) 0.5 MG tablet Take 0.5 mg by mouth 2 (two) times daily. For psychosis    Historical Provider, MD   BP 108/86 mmHg  Pulse 163  Resp 50  SpO2 97% Physical Exam  Nursing note and vitals reviewed.  74 year old male, who is very tachypneic. Vital signs are significant for fever, tachycardia, tachypnea. Oxygen saturation is 97%, which is normal, but his while he is on 100% oxygen via nonrebreather mask. Head is normocephalic and atraumatic. Pupils are 3 mm. Ocular bobbing is noted. Unable to test EOMs. Oropharynx is clear. Neck: His head is turned completely to the left and tilted to the right. There is no adenopathy or JVD. Back is nontender and there is no CVA tenderness. Lungs have markedly diminished breath sounds on the right side. There are no rales, wheezes, or rhonchi. Chest is nontender. Heart is tachycardic, tones are  distant. Abdomen is soft, flat, nontender without masses or hepatosplenomegaly and peristalsis is normoactive. Extremities have no cyanosis or edema. Legs are somewhat mottled. Skin is warm and dry without rash. Neurologic: He is awake but not responding to verbal or painful stimuli. It is uncertain how this compares with his baseline.  ED Course  Procedures (including critical care time) Labs Review Results for orders placed or performed  during the hospital encounter of 04/21/2014  Culture, blood (routine x 2)  Result Value Ref Range   Specimen Description LEFT ANTECUBITAL    Special Requests BOTTLES DRAWN AEROBIC ONLY 8CC    Culture PENDING    Report Status PENDING   Culture, blood (routine x 2)  Result Value Ref Range   Specimen Description BLOOD LEFT ARM    Special Requests BOTTLES DRAWN AEROBIC AND ANAEROBIC 6CC    Culture PENDING    Report Status PENDING   Comprehensive metabolic panel  Result Value Ref Range   Sodium 140 135 - 145 mmol/L   Potassium 4.4 3.5 - 5.1 mmol/L   Chloride 111 96 - 112 mmol/L   CO2 25 19 - 32 mmol/L   Glucose, Bld 121 (H) 70 - 99 mg/dL   BUN 38 (H) 6 - 23 mg/dL   Creatinine, Ser 1.91 0.50 - 1.35 mg/dL   Calcium 8.2 (L) 8.4 - 10.5 mg/dL   Total Protein 8.0 6.0 - 8.3 g/dL   Albumin 2.4 (L) 3.5 - 5.2 g/dL   AST 28 0 - 37 U/L   ALT 19 0 - 53 U/L   Alkaline Phosphatase 68 39 - 117 U/L   Total Bilirubin 0.4 0.3 - 1.2 mg/dL   GFR calc non Af Amer 82 (L) >90 mL/min   GFR calc Af Amer >90 >90 mL/min   Anion gap 4 (L) 5 - 15  Troponin I  Result Value Ref Range   Troponin I <0.03 <0.031 ng/mL  Brain natriuretic peptide  Result Value Ref Range   B Natriuretic Peptide 32.0 0.0 - 100.0 pg/mL  CBC with Differential  Result Value Ref Range   WBC 16.2 (H) 4.0 - 10.5 K/uL   RBC 3.85 (L) 4.22 - 5.81 MIL/uL   Hemoglobin 11.8 (L) 13.0 - 17.0 g/dL   HCT 47.8 (L) 29.5 - 62.1 %   MCV 94.3 78.0 - 100.0 fL   MCH 30.6 26.0 - 34.0 pg   MCHC 32.5 30.0 - 36.0 g/dL   RDW 30.8 65.7 - 84.6 %   Platelets 358 150 - 400 K/uL   Neutrophils Relative % 89 (H) 43 - 77 %   Neutro Abs 14.5 (H) 1.7 - 7.7 K/uL   Lymphocytes Relative 5 (L) 12 - 46 %   Lymphs Abs 0.8 0.7 - 4.0 K/uL   Monocytes Relative 6 3 - 12 %   Monocytes Absolute 0.9 0.1 - 1.0 K/uL   Eosinophils Relative 0 0 - 5 %   Eosinophils Absolute 0.0 0.0 - 0.7 K/uL   Basophils Relative 0 0 - 1 %   Basophils Absolute 0.0 0.0 - 0.1 K/uL  Urinalysis,  Routine w reflex microscopic  Result Value Ref Range   Color, Urine YELLOW YELLOW   APPearance CLOUDY (A) CLEAR   Specific Gravity, Urine >1.030 (H) 1.005 - 1.030   pH 5.5 5.0 - 8.0   Glucose, UA NEGATIVE NEGATIVE mg/dL   Hgb urine dipstick MODERATE (A) NEGATIVE   Bilirubin Urine SMALL (A) NEGATIVE   Ketones, ur 15 (A) NEGATIVE mg/dL  Protein, ur 30 (A) NEGATIVE mg/dL   Urobilinogen, UA 1.0 0.0 - 1.0 mg/dL   Nitrite POSITIVE (A) NEGATIVE   Leukocytes, UA SMALL (A) NEGATIVE  Urine microscopic-add on  Result Value Ref Range   Squamous Epithelial / LPF FEW (A) RARE   WBC, UA TOO NUMEROUS TO COUNT <3 WBC/hpf   RBC / HPF 3-6 <3 RBC/hpf   Bacteria, UA MANY (A) RARE  I-Stat CG4 Lactic Acid, ED  Result Value Ref Range   Lactic Acid, Venous 1.36 0.5 - 2.0 mmol/L   Imaging Review Dg Chest Port 1 View  03/01/2014   CLINICAL DATA:  Shortness of breath and respiratory distress. Hypoxia and tachycardia.  EXAM: PORTABLE CHEST - 1 VIEW  COMPARISON:  12/13/2013  FINDINGS: Elevation of right hemidiaphragm. There is a right pleural effusion, volume loss in the right hemithorax, and diffuse ill-defined right-sided opacity. This is progressed from prior exam. Left lung is grossly clear. Mediastinal contours are partially obscured, appear stable where visualized. The patient's chin obscures the right lung apex.  IMPRESSION: Pleural parenchymal opacity in the right hemithorax with volume loss suggesting at least some degree of atelectasis/collapse, as well as pleural effusion component.   Electronically Signed   By: Rubye Oaks M.D.   On: 03/17/2014 02:18   Images viewed by me.   EKG Interpretation   Date/Time:  Sunday March 28 2014 01:27:29 EST Ventricular Rate:  163 PR Interval:  238 QRS Duration: 92 QT Interval:  320 QTC Calculation: 527 R Axis:   125 Text Interpretation:  Supraventricular tachycardia Low voltage, extremity  leads When compared with ECG of 06/09/2009, HEART RATE has  increased  Confirmed by [facility]  MD, Deondrea Aguado ([id number]) on 03/09/2014 2:34:50 AM      CRITICAL CARE Performed by: UEAVW,[email or web address] Total critical care time: 75 minutes Critical care time was exclusive of separately billable procedures and treating other patients. Critical care was necessary to treat or prevent imminent or life-threatening deterioration. Critical care was time spent personally by me on the following activities: development of treatment plan with patient and/or surrogate as well as nursing, discussions with consultants, evaluation of patient's response to treatment, examination of patient, obtaining history from patient or surrogate, ordering and performing treatments and interventions, ordering and review of laboratory studies, ordering and review of radiographic studies, pulse oximetry and re-evaluation of patient's condition.  MDM   Final diagnoses:  Shortness of breath  HCAP (healthcare-associated pneumonia)  Prerenal azotemia  Normochromic normocytic anemia  Urinary tract infection without hematuria, site unspecified    Fever with sepsis. Chest x-ray and urinalysis will need to be checked as they are the most likely sources.  Chest x-ray shows probable pleural effusion and pneumonia on the right side. He is started on antibiotics for healthcare associated pneumonia. He is penicillin allergic, so history and I'm is given in stead of Zosyn. Lactic acid level is come back normal. Case is discussed with Dr. Selena Batten of [facility] agrees to admit the patient.  Dione Booze, MD  Urinalysis has come back significant for a UTI. Patient's antibiotics should adequately cover urinary tract infection. 03/10/2014 9147  Dione Booze, MD 03/26/2014 0430

## 2014-03-28 NOTE — H&P (Addendum)
[name] is an 74 y.o. male.    Dr. Willey Blade (pcp) Code status DNR  Chief Complaint: fever HPI: 74 yo male with dementia, parkinsons, apparently developed fever while at the [facility]. Pt was sent to ED for evaluation.  CXR noted ? Effusion, infiltrate at the RLL base.  Pt had wbc elevation at 16.2.  Pt is unable to give clear history due to his dementia. Pt will be admitted for hcap, ? Sepsis.   Past Medical History  Diagnosis Date  . Head injury, unspecified   . Secondary parkinsonism   . Dementia, unspecified, without behavioral disturbance     History reviewed. No pertinent past surgical history.  History reviewed. No pertinent family history. Social History:  reports that he has been smoking.  He does not have any smokeless tobacco history on file. He reports that he does not drink alcohol or use illicit drugs.  Allergies:  Allergies  Allergen Reactions  . Penicillins    Medications reviewed  (Not in a hospital admission)  Results for orders placed or performed during the hospital encounter of 03/18/2014 (from the past 48 hour(s))  Comprehensive metabolic panel     Status: Abnormal   Collection Time: 03/25/2014  2:17 AM  Result Value Ref Range   Sodium 140 135 - 145 mmol/L   Potassium 4.4 3.5 - 5.1 mmol/L   Chloride 111 96 - 112 mmol/L   CO2 25 19 - 32 mmol/L   Glucose, Bld 121 (H) 70 - 99 mg/dL   BUN 38 (H) 6 - 23 mg/dL   Creatinine, Ser 0.94 0.50 - 1.35 mg/dL   Calcium 8.2 (L) 8.4 - 10.5 mg/dL   Total Protein 8.0 6.0 - 8.3 g/dL   Albumin 2.4 (L) 3.5 - 5.2 g/dL   AST 28 0 - 37 U/L   ALT 19 0 - 53 U/L   Alkaline Phosphatase 68 39 - 117 U/L   Total Bilirubin 0.4 0.3 - 1.2 mg/dL   GFR calc non Af Amer 82 (L) >90 mL/min   GFR calc Af Amer >90 >90 mL/min    Comment: (NOTE) The eGFR has been calculated using the CKD EPI equation. This calculation has not been validated in all clinical situations. eGFR's persistently <90 mL/min signify possible Chronic  Kidney Disease.    Anion gap 4 (L) 5 - 15  Troponin I     Status: None   Collection Time: 03/27/2014  2:17 AM  Result Value Ref Range   Troponin I <0.03 <0.031 ng/mL    Comment:        NO INDICATION OF MYOCARDIAL INJURY.   CBC with Differential     Status: Abnormal   Collection Time: 03/24/2014  2:17 AM  Result Value Ref Range   WBC 16.2 (H) 4.0 - 10.5 K/uL   RBC 3.85 (L) 4.22 - 5.81 MIL/uL   Hemoglobin 11.8 (L) 13.0 - 17.0 g/dL   HCT 36.3 (L) 39.0 - 52.0 %   MCV 94.3 78.0 - 100.0 fL   MCH 30.6 26.0 - 34.0 pg   MCHC 32.5 30.0 - 36.0 g/dL   RDW 13.8 11.5 - 15.5 %   Platelets 358 150 - 400 K/uL   Neutrophils Relative % 89 (H) 43 - 77 %   Neutro Abs 14.5 (H) 1.7 - 7.7 K/uL   Lymphocytes Relative 5 (L) 12 - 46 %   Lymphs Abs 0.8 0.7 - 4.0 K/uL   Monocytes Relative 6 3 - 12 %  Monocytes Absolute 0.9 0.1 - 1.0 K/uL   Eosinophils Relative 0 0 - 5 %   Eosinophils Absolute 0.0 0.0 - 0.7 K/uL   Basophils Relative 0 0 - 1 %   Basophils Absolute 0.0 0.0 - 0.1 K/uL  I-Stat CG4 Lactic Acid, ED     Status: None   Collection Time: 03/10/2014  2:27 AM  Result Value Ref Range   Lactic Acid, Venous 1.36 0.5 - 2.0 mmol/L  Culture, blood (routine x 2)     Status: None (Preliminary result)   Collection Time: 03/27/2014  3:04 AM  Result Value Ref Range   Specimen Description LEFT ANTECUBITAL    Special Requests BOTTLES DRAWN AEROBIC ONLY 8CC    Culture PENDING    Report Status PENDING   Culture, blood (routine x 2)     Status: None (Preliminary result)   Collection Time: 03/24/2014  3:10 AM  Result Value Ref Range   Specimen Description BLOOD LEFT ARM    Special Requests BOTTLES DRAWN AEROBIC AND ANAEROBIC 6CC    Culture PENDING    Report Status PENDING    Dg Chest Port 1 View  03/10/2014   CLINICAL DATA:  Shortness of breath and respiratory distress. Hypoxia and tachycardia.  EXAM: PORTABLE CHEST - 1 VIEW  COMPARISON:  12/13/2013  FINDINGS: Elevation of right hemidiaphragm. There is a right  pleural effusion, volume loss in the right hemithorax, and diffuse ill-defined right-sided opacity. This is progressed from prior exam. Left lung is grossly clear. Mediastinal contours are partially obscured, appear stable where visualized. The patient's chin obscures the right lung apex.  IMPRESSION: Pleural parenchymal opacity in the right hemithorax with volume loss suggesting at least some degree of atelectasis/collapse, as well as pleural effusion component.   Electronically Signed   By: Jeb Levering M.D.   On: 03/03/2014 02:18    Review of Systems  Constitutional: Positive for fever. Negative for chills, weight loss, malaise/fatigue and diaphoresis.  HENT: Negative for congestion, ear discharge, ear pain, hearing loss, nosebleeds, sore throat and tinnitus.   Eyes: Negative for blurred vision, double vision, photophobia, pain, discharge and redness.  Respiratory: Positive for shortness of breath. Negative for cough, hemoptysis, sputum production, wheezing and stridor.   Cardiovascular: Negative for chest pain, palpitations, orthopnea, claudication, leg swelling and PND.  Gastrointestinal: Negative for heartburn, nausea, vomiting, abdominal pain, diarrhea, constipation, blood in stool and melena.  Genitourinary: Negative for dysuria, urgency, frequency, hematuria and flank pain.  Musculoskeletal: Negative for myalgias, back pain, joint pain, falls and neck pain.  Skin: Negative for itching and rash.  Neurological: Negative for dizziness, tingling, tremors, sensory change, speech change, focal weakness, seizures, loss of consciousness, weakness and headaches.  Endo/Heme/Allergies: Negative for environmental allergies and polydipsia. Does not bruise/bleed easily.  Psychiatric/Behavioral: Negative for depression, suicidal ideas, hallucinations, memory loss and substance abuse. The patient is not nervous/anxious and does not have insomnia.     Blood pressure 105/64, pulse 136, temperature 102.7  F (39.3 C), temperature source Core (Comment), resp. rate 23, height 6' 1"  (1.854 m), weight 97.523 kg (215 lb), SpO2 99 %. Physical Exam  Constitutional: He is oriented to person, place, and time. He appears well-developed and well-nourished.  HENT:  Head: Normocephalic and atraumatic.  Mouth/Throat: No oropharyngeal exudate.  Eyes: Conjunctivae and EOM are normal. Pupils are equal, round, and reactive to light. No scleral icterus.  Neck: Normal range of motion. Neck supple. No JVD present. No tracheal deviation present. No thyromegaly present.  Cardiovascular:  Regular rhythm.  Exam reveals no gallop and no friction rub.   No murmur heard. Tachycardia s1, s2  Respiratory: He is in respiratory distress. He has no wheezes. He has rales.  Decreased bs right lung base, slight crackles, right and left base  GI: Soft. Bowel sounds are normal. He exhibits no distension. There is no tenderness. There is no rebound and no guarding.  Musculoskeletal: Normal range of motion. He exhibits no edema or tenderness.  Lymphadenopathy:    He has no cervical adenopathy.  Neurological: He is alert and oriented to person, place, and time. He has normal reflexes. He displays normal reflexes. No cranial nerve deficit. He exhibits normal muscle tone. Coordination normal.  Skin: Skin is warm and dry. No rash noted. No erythema. No pallor.  Psychiatric: He has a normal mood and affect. His behavior is normal. Judgment and thought content normal.     Assessment/Plan Hcap Blood culture x2 sets Pt doesn't appears to be able to produce sputum Consider check CT scan to see if there is a loculated effusion tx with vanco iv pharmacy to dose, aztreonam iv pharmacy to dose and levaquin iv pharmacy to dose  Tachycardia probably related to sepsis Check tsh Check trop IJ5T x3 Consider check echo  Anemia Check cbc in am  Protein calorie malnutrition Nutrition consultation  DVT prophylaxis: scd and lovenox  LEONELL, LOBDELL 03/07/2014, 3:55 AM

## 2014-03-28 NOTE — Progress Notes (Signed)
Assumption of care: pt of Dr. Karleen HampshireFagans admitted with pneumonia, dementia, parkinsons. Current treatment appropriate. NPO for now.

## 2014-03-28 NOTE — ED Notes (Signed)
Lab at bedside

## 2014-03-28 NOTE — ED Notes (Signed)
Per EMS, patient from [facility] with c/o respiratory distress.  Patient with increased HR, decreased O2 sats.  Patient tachypneic.

## 2014-03-28 NOTE — Progress Notes (Signed)
Pt transferred to unit from ED. Pt is resting comfortable in bed with family at bedside. VS are stable with HR 100, BP 113/68, RR27, SPO2 100% on nonrebreather. Will continue to monitor.

## 2014-03-29 ENCOUNTER — Inpatient Hospital Stay (HOSPITAL_COMMUNITY): Payer: Medicare Other

## 2014-03-29 DIAGNOSIS — A419 Sepsis, unspecified organism: Secondary | ICD-10-CM | POA: Diagnosis not present

## 2014-03-29 LAB — CBC WITH DIFFERENTIAL/PLATELET
BASOS ABS: 0 10*3/uL (ref 0.0–0.1)
Basophils Relative: 0 % (ref 0–1)
EOS ABS: 0 10*3/uL (ref 0.0–0.7)
Eosinophils Relative: 0 % (ref 0–5)
HCT: 34.7 % — ABNORMAL LOW (ref 39.0–52.0)
Hemoglobin: 10.6 g/dL — ABNORMAL LOW (ref 13.0–17.0)
LYMPHS PCT: 5 % — AB (ref 12–46)
Lymphs Abs: 1.4 10*3/uL (ref 0.7–4.0)
MCH: 29.6 pg (ref 26.0–34.0)
MCHC: 30.5 g/dL (ref 30.0–36.0)
MCV: 96.9 fL (ref 78.0–100.0)
MONO ABS: 2 10*3/uL — AB (ref 0.1–1.0)
Monocytes Relative: 7 % (ref 3–12)
Neutro Abs: 26.8 10*3/uL — ABNORMAL HIGH (ref 1.7–7.7)
Neutrophils Relative %: 89 % — ABNORMAL HIGH (ref 43–77)
Platelets: 276 10*3/uL (ref 150–400)
RBC: 3.58 MIL/uL — ABNORMAL LOW (ref 4.22–5.81)
RDW: 14.5 % (ref 11.5–15.5)
WBC Morphology: INCREASED
WBC: 30.3 10*3/uL — ABNORMAL HIGH (ref 4.0–10.5)

## 2014-03-29 LAB — BASIC METABOLIC PANEL
Anion gap: 6 (ref 5–15)
BUN: 37 mg/dL — ABNORMAL HIGH (ref 6–23)
CHLORIDE: 117 mmol/L — AB (ref 96–112)
CO2: 23 mmol/L (ref 19–32)
Calcium: 7.9 mg/dL — ABNORMAL LOW (ref 8.4–10.5)
Creatinine, Ser: 0.7 mg/dL (ref 0.50–1.35)
GFR calc non Af Amer: >90 mL/min (ref >90)
GLUCOSE: 85 mg/dL (ref 70–99)
POTASSIUM: 4.8 mmol/L (ref 3.5–5.1)
SODIUM: 146 mmol/L — AB (ref 135–145)

## 2014-03-29 MED ORDER — MORPHINE BOLUS VIA INFUSION
5.0000 mg | INTRAVENOUS | Status: DC | PRN
  Filled 2014-03-29: qty 20

## 2014-03-29 MED ORDER — MORPHINE SULFATE 25 MG/ML IV SOLN
10.0000 mg/h | INTRAVENOUS | Status: DC
  Administered 2014-03-29: 3 mg/h via INTRAVENOUS
  Filled 2014-03-29 (×2): qty 10

## 2014-03-29 MED ORDER — MORPHINE SULFATE 2 MG/ML IJ SOLN
2.0000 mg | INTRAMUSCULAR | Status: DC | PRN
  Administered 2014-03-29 (×2): 2 mg via INTRAVENOUS
  Filled 2014-03-29 (×2): qty 1

## 2014-03-29 MED ORDER — DEXTROSE-NACL 5-0.45 % IV SOLN
INTRAVENOUS | Status: DC
  Administered 2014-03-29 (×2): via INTRAVENOUS

## 2014-03-29 MED ORDER — MORPHINE SULFATE 25 MG/ML IV SOLN
INTRAVENOUS | Status: AC
  Filled 2014-03-29: qty 10

## 2014-03-29 NOTE — Progress Notes (Signed)
eLink Physician-Brief Progress Note Patient Name: [name] DOB: 03/02/1940 MRN: [id number]   Date of Service  03/29/2014  HPI/Events of Note  Dnr, he is in extreme distress, he is actively die Have paged attending to let known meed morphine drip  eICU Interventions  fam awatre at bedside     Intervention Category Major Interventions: End of life / care limitation discussion  Nelda BucksFEINSTEIN,DANIEL J. 03/29/2014, 5:43 PM

## 2014-03-29 NOTE — Progress Notes (Signed)
Pt developed tachycardia with rate >150 bpm. MD notified, and orders given to perform 12-lead EKG. Copy of EKG faxed to MD office. No new orders given.

## 2014-03-29 NOTE — Clinical Social Work Psychosocial (Signed)
Clinical Social Work Department BRIEF PSYCHOSOCIAL ASSESSMENT 03/29/2014  Patient:  [name],[name]     Account Number:  [id number]     Admit date:  03/03/2014  Clinical Social Worker:  Trilby LeaverSETTLE,Timmya Blazier, LCSW  Date/Time:  03/29/2014 11:59 AM  Referred by:  CSW  Date Referred:  03/29/2014 Referred for  SNF Placement   Other Referral:   Interview type:  Other - See comment Other interview type:   [name]  Left message for [name] requesting return contact    PSYCHOSOCIAL DATA Living Status:  FACILITY Ad[facility] Level of care:  Skilled Nursing Facility Primary support name:  [name] Primary support relationship to patient:  SIBLING Degree of support available:   Patient has supportive family.    CURRENT CONCERNS Current Concerns  Post-Acute Placement   Other Concerns:    SOCIAL WORK ASSESSMENT / PLAN Patient could not be interviewed due to his inability to communicate and condition.  CSW left a message for [name] requesting return contact.  [facility] advised [facility].  She stated that he is considered total care.  Keri indicated that patient uses a wheelchair and at baseline he is unable to propel his wheelchair independently.  Keri patient has not been hospitalized in years.  She stated that patient's sister is his guardian.  Keri indicated that patient can return to the facility upon discharge.   Assessment/plan status:  Information[facility] to [facility] Other assessment/ plan:   Information/referral to community resources:    PATIENT'S/FAMILY'S RESPONSE TO PLAN OF CARE: CSW is awaiting return call from [name].  CSW will try to recontact her again tomorrow.  [facility] to [facility].    Tretha SciaraHeather Jaskirat Schwieger, Kentucky[place]CS[place]  [phone]

## 2014-03-29 NOTE — Progress Notes (Signed)
INITIAL NUTRITION ASSESSMENT  INTERVENTION: Magic cup TID with meals, each supplement provides 290 kcal and 9 grams of protein  Recommend ST evaluate state of current swallow function   Assistance with feeding all meals   NUTRITION DIAGNOSIS: Inadequate oral intake related to respiratory illness as evidenced by HCAP and pt meal intake 0% at this time and decreased meal intake 2 days prior to admission   Goal: Pt to meet >/= 90% of their estimated nutrition needs    Monitor:  Po intake, labs and wt trends   Reason for Assessment: Malnutrition Screen   74 y.o. male  ASSESSMENT: Pt from SNF. Hx includes parkinson's, anemia, dementia and traumatic brain injury. He presents with healthcare associated pneumonia and UTI. Pt had temp of 102 at 1325 today. He is on a non-rebreather mask.  No significant changes in weight status but pt having increased difficulty tolerating po intake. Nursing reports increased coughing with purred foods and pudding-thick liquids. Nutrition focused exam: deferred at this time Labs: Sodium 146, Chloride 117, BUN 37, Hemoglobin 10.6, Hct 34.7. WBC's 30.3   Height: Ht Readings from Last 1 Encounters:  May 07, 2014 6\' 1"  (1.854 m)    Weight: Wt Readings from Last 1 Encounters:  03/29/14 177 lb 14.6 oz (80.7 kg)   Ideal Body Weight: 184# (83.6kg)  % Ideal Body Weight: 97%  Wt Readings from Last 10 Encounters:  03/29/14 177 lb 14.6 oz (80.7 kg)    Usual Body Weight: 170-177# (past 6 months)  % Usual Body Weight: 100%  BMI:  Body mass index is 23.48 kg/(m^2). normal range  Estimated Nutritional Needs: Kcal: 2430 Protein: 121-138 gr Fluid: >2.4 liters daily  Skin: intact  Diet Order: DIET - DYS 1 with pudding thick liquids  EDUCATION NEEDS: -No education needs identified at this time   Intake/Output Summary (Last 24 hours) at 03/29/14 1431 Last data filed at 03/29/14 1301  Gross per 24 hour  Intake 4225.91 ml  Output    550 ml  Net  3675.91 ml    Last BM:  Prior to admission  Labs:   Recent Labs Lab May 07, 2014 0217 03/29/14 0455  NA 140 146*  K 4.4 4.8  CL 111 117*  CO2 25 23  BUN 38* 37*  CREATININE 0.94 0.70  CALCIUM 8.2* 7.9*  GLUCOSE 121* 85    CBG (last 3)  No results for input(s): GLUCAP in the last 72 hours.  Scheduled Meds: . antiseptic oral rinse  7 mL Mouth Rinse BID  . aztreonam  1 g Intravenous 3 times per day  . enoxaparin (LOVENOX) injection  40 mg Subcutaneous Q24H  . levofloxacin (LEVAQUIN) IV  750 mg Intravenous Q24H  . risperiDONE  0.5 mg Oral BID  . sodium chloride  3 mL Intravenous Q12H  . vancomycin  1,000 mg Intravenous Q12H    Continuous Infusions: . dextrose 5 % and 0.45% NaCl 125 mL/hr at 03/29/14 1301    Past Medical History  Diagnosis Date  . Head injury, unspecified   . Secondary parkinsonism   . Dementia, unspecified, without behavioral disturbance     History reviewed. No pertinent past surgical history.  Royann ShiversLynn Montana Bryngelson MS,RD,CSG,LDN Office: [phone] Pager: [phone]

## 2014-03-29 NOTE — Progress Notes (Signed)
SLP Cancellation Note  Patient Details Name: [name] MRN: [id number] DOB: 08/26/1940   Cancelled treatment:       Reason Eval/Treat Not Completed: Other (comment); I called and spoke with [name], RN who reported that pt was less responsive at this time. Pt is known to me from previous MBSS completed in 2014. He has a history of significant dysphagia and aspiration. Pt has been consuming puree with pudding-thick liquids with high risk for aspiration. Pt/family did not want feeding tubes in 2014. SLP will follow up tomorrow morning to determine goals of care.   Previous MBSS from 2014: Moderate/severe sensorimotor based oropharyngeal phase dysphagia negatively impacted by poor pt positioning (neck flexion to right). It was very difficult to visualize pharyngeal structures due to head flexion and shoulder blocking view. He was seen in both the lateral and anterior position for optimal viewing. He initially appeared to tolerate puree, pudding and honey thick liquids via teaspoon (only trace penetration observed), however pt aspirated straw sips nectar-thick liquid when challenged (as SLP indicated that he coughs terribly during meals and he had not done so during this evaluation). Gross amount nectars aspirated with very delayed cough (aspirate moved well down tracheal wall). He was able to remove most of aspirate. He was then given another teaspoon of puree and he aspirated gross amount before the swallow which elicited delayed cough. When the pt was cued to cough strongly, he was able to remove aspirate into laryngeal vestibule, but was unable to swallow or clear and ended up aspirating residuals repeatedly. Breath holds and head turns were ineffective in removing from laryngeal vestibule. Given chronic dysphagia and its severity, no diet recommendation will be completely safe. Family has declined alternative means of nutrition and will continue with puree diet with pudding thick liquids. SLP recommends  assisting pt with all meals via small bites and sips (via tsp presentaion), feeding slowly. Pt should be sitting upright for all po and well after po to reduce possibility of reflux. Pt may benefit from a special head rest/wheel chair to help reposition his head to more upright position.   Thank you,  [name], CCC-SLP [phone]  [name] 03/29/2014, 5:14 PM

## 2014-03-29 NOTE — Progress Notes (Signed)
Subjective: [name] was admitted early yesterday morning after presenting with respiratory distress. He has been diagnosed with a right-sided pneumonia. His maximum temperature has been 100.8. His white count is up to 30,000. He has been treated with levofloxacin, H Tran a.m. and vancomycin. He is requiring oxygen support with a 100% nonrebreather. Oxygen saturation now is 100% on this. He is tachycardic at 1:30. He is breathing about 30 times a minute. He is normotensive.  Objective: Vital signs in last 24 hours: Filed Vitals:   03/29/14 0300 03/29/14 0400 03/29/14 0500 03/29/14 0600  BP: 108/58 124/67 115/65 139/65  Pulse: 111 120 120 123  Temp:  99.5 F (37.5 C)    TempSrc:  Core (Comment)    Resp: 28 28 26 29   Height:      Weight:   177 lb 14.6 oz (80.7 kg)   SpO2: 97% 96% 96% 100%   Weight change: -40 lb 14.8 oz (-18.563 kg)  Intake/Output Summary (Last 24 hours) at 03/29/14 0725 Last data filed at 03/29/14 0600  Gross per 24 hour  Intake 4093.83 ml  Output    400 ml  Net 3693.83 ml    Physical Exam: He is awake. Tachypnea. Lungs reveal rhonchi. Heart tachycardic with no murmurs. Abdomen soft and nondistended. Nontender with no palpable organomegaly. Extremities reveal no edema. Neuro at baseline.  Lab Results:    Results for orders placed or performed during the hospital encounter of 03/18/2014 (from the past 24 hour(s))  MRSA PCR Screening     Status: None   Collection Time: 03/08/2014  1:25 PM  Result Value Ref Range   MRSA by PCR NEGATIVE NEGATIVE  CBC with Differential/Platelet     Status: Abnormal   Collection Time: 03/29/14  4:55 AM  Result Value Ref Range   WBC 30.3 (H) 4.0 - 10.5 K/uL   RBC 3.58 (L) 4.22 - 5.81 MIL/uL   Hemoglobin 10.6 (L) 13.0 - 17.0 g/dL   HCT 96.234.7 (L) 95.239.0 - 84.152.0 %   MCV 96.9 78.0 - 100.0 fL   MCH 29.6 26.0 - 34.0 pg   MCHC 30.5 30.0 - 36.0 g/dL   RDW 32.414.5 40.111.5 - 02.715.5 %   Platelets 276 150 - 400 K/uL   Neutrophils Relative % 89 (H) 43 - 77 %    Neutro Abs 26.8 (H) 1.7 - 7.7 K/uL   Lymphocytes Relative 5 (L) 12 - 46 %   Lymphs Abs 1.4 0.7 - 4.0 K/uL   Monocytes Relative 7 3 - 12 %   Monocytes Absolute 2.0 (H) 0.1 - 1.0 K/uL   Eosinophils Relative 0 0 - 5 %   Eosinophils Absolute 0.0 0.0 - 0.7 K/uL   Basophils Relative 0 0 - 1 %   Basophils Absolute 0.0 0.0 - 0.1 K/uL   WBC Morphology INCREASED BANDS (>20% BANDS)   Basic metabolic panel     Status: Abnormal   Collection Time: 03/29/14  4:55 AM  Result Value Ref Range   Sodium 146 (H) 135 - 145 mmol/L   Potassium 4.8 3.5 - 5.1 mmol/L   Chloride 117 (H) 96 - 112 mmol/L   CO2 23 19 - 32 mmol/L   Glucose, Bld 85 70 - 99 mg/dL   BUN 37 (H) 6 - 23 mg/dL   Creatinine, Ser 2.530.70 0.50 - 1.35 mg/dL   Calcium 7.9 (L) 8.4 - 10.5 mg/dL   GFR calc non Af Amer >90 >90 mL/min   GFR calc Af Amer >90 >90 mL/min  Anion gap 6 5 - 15     ABGS No results for input(s): PHART, PO2ART, TCO2, HCO3 in the last 72 hours.  Invalid input(s): PCO2 CULTURES Recent Results (from the past 240 hour(s))  Culture, blood (routine x 2)     Status: None (Preliminary result)   Collection Time: 03/24/2014  3:04 AM  Result Value Ref Range Status   Specimen Description LEFT ANTECUBITAL  Final   Special Requests BOTTLES DRAWN AEROBIC ONLY 8CC  Final   Culture NO GROWTH <24 HRS  Final   Report Status PENDING  Incomplete  Culture, blood (routine x 2)     Status: None (Preliminary result)   Collection Time: 03/24/2014  3:10 AM  Result Value Ref Range Status   Specimen Description BLOOD LEFT ARM  Final   Special Requests BOTTLES DRAWN AEROBIC AND ANAEROBIC 6CC  Final   Culture NO GROWTH <24 HRS  Final   Report Status PENDING  Incomplete  MRSA PCR Screening     Status: None   Collection Time: 03/12/2014  1:25 PM  Result Value Ref Range Status   MRSA by PCR NEGATIVE NEGATIVE Final    Comment:        The GeneXpert MRSA Assay (FDA approved for NASAL specimens only), is one component of a comprehensive MRSA  colonization surveillance program. It is not intended to diagnose MRSA infection nor to guide or monitor treatment for MRSA infections.    Studies/Results: Dg Chest Port 1 View  03/22/2014   CLINICAL DATA:  Shortness of breath and respiratory distress. Hypoxia and tachycardia.  EXAM: PORTABLE CHEST - 1 VIEW  COMPARISON:  12/13/2013  FINDINGS: Elevation of right hemidiaphragm. There is a right pleural effusion, volume loss in the right hemithorax, and diffuse ill-defined right-sided opacity. This is progressed from prior exam. Left lung is grossly clear. Mediastinal contours are partially obscured, appear stable where visualized. The patient's chin obscures the right lung apex.  IMPRESSION: Pleural parenchymal opacity in the right hemithorax with volume loss suggesting at least some degree of atelectasis/collapse, as well as pleural effusion component.   Electronically Signed   By: Rubye Oaks M.D.   On: 03/04/2014 02:18   Micro Results: Recent Results (from the past 240 hour(s))  Culture, blood (routine x 2)     Status: None (Preliminary result)   Collection Time: 03/08/2014  3:04 AM  Result Value Ref Range Status   Specimen Description LEFT ANTECUBITAL  Final   Special Requests BOTTLES DRAWN AEROBIC ONLY 8CC  Final   Culture NO GROWTH <24 HRS  Final   Report Status PENDING  Incomplete  Culture, blood (routine x 2)     Status: None (Preliminary result)   Collection Time: 03/20/2014  3:10 AM  Result Value Ref Range Status   Specimen Description BLOOD LEFT ARM  Final   Special Requests BOTTLES DRAWN AEROBIC AND ANAEROBIC 6CC  Final   Culture NO GROWTH <24 HRS  Final   Report Status PENDING  Incomplete  MRSA PCR Screening     Status: None   Collection Time: 03/11/2014  1:25 PM  Result Value Ref Range Status   MRSA by PCR NEGATIVE NEGATIVE Final    Comment:        The GeneXpert MRSA Assay (FDA approved for NASAL specimens only), is one component of a comprehensive MRSA  colonization surveillance program. It is not intended to diagnose MRSA infection nor to guide or monitor treatment for MRSA infections.    Studies/Results: Dg Chest [employer]  1 View  2014-04-03   CLINICAL DATA:  Shortness of breath and respiratory distress. Hypoxia and tachycardia.  EXAM: PORTABLE CHEST - 1 VIEW  COMPARISON:  12/13/2013  FINDINGS: Elevation of right hemidiaphragm. There is a right pleural effusion, volume loss in the right hemithorax, and diffuse ill-defined right-sided opacity. This is progressed from prior exam. Left lung is grossly clear. Mediastinal contours are partially obscured, appear stable where visualized. The patient's chin obscures the right lung apex.  IMPRESSION: Pleural parenchymal opacity in the right hemithorax with volume loss suggesting at least some degree of atelectasis/collapse, as well as pleural effusion component.   Electronically Signed   By: Rubye Oaks M.D.   On: 2014/04/03 02:18   Medications:  I have reviewed the patient's current medications Scheduled Meds: . antiseptic oral rinse  7 mL Mouth Rinse BID  . aztreonam  1 g Intravenous 3 times per day  . enoxaparin (LOVENOX) injection  40 mg Subcutaneous Q24H  . levofloxacin (LEVAQUIN) IV  750 mg Intravenous Q24H  . pneumococcal 23 valent vaccine  0.5 mL Intramuscular Tomorrow-1000  . risperiDONE  0.5 mg Oral BID  . sodium chloride  3 mL Intravenous Q12H  . vancomycin  1,000 mg Intravenous Q12H   Continuous Infusions: . sodium chloride 1,000 mL (03/29/14 0600)   PRN Meds:.acetaminophen **OR** acetaminophen   Assessment/Plan: #1. Healthcare associated pneumonia. Continue current antibiotic therapy and oxygen support. No intubation or mechanical ventilation. #2. UTI. Urinalysis reveals too numerous to count white cells and many bacteria. #3. History of traumatic brain injury. Active Problems:   HCAP (healthcare-associated pneumonia)   Anemia   Protein calorie malnutrition     LOS: 1  day   Othella Slappey 03/29/2014, 7:25 AM

## 2014-03-30 DEATH — deceased

## 2014-03-31 LAB — URINE CULTURE: Colony Count: >=100000

## 2014-03-31 NOTE — Progress Notes (Signed)
Approximately 150 mg of IV morphine wasted from remaining morphine drip after patient was picked up by funeral home. Witnessed by [employer].Keith, RN

## 2014-04-02 LAB — CULTURE, BLOOD (ROUTINE X 2)
CULTURE: NO GROWTH
Culture: NO GROWTH

## 2014-04-06 NOTE — Discharge Summary (Signed)
Physician Discharge Summary  Tawni PummelJames Y Saab ZOX:[id number] DOB: 05/25/1940 DOA: 03/06/2014   Admit date: 03/04/2014 Discharge date: 04/06/2014  Discharge Diagnoses:  Active Problems:   HCAP (healthcare-associated pneumonia)   Anemia   Protein calorie malnutrition    Wt Readings from Last 3 Encounters:  03/29/14 177 lb 14.6 oz (80.7 kg)     Hospital Course:  This patient was a 74 year old male resident of the [facility] who presented with respiratory distress. He had fever and hypoxia. He had chest x-ray evidence of acute pneumonia. Antibiotic therapy was probably initiated. He required oxygen support with a 100% nonrebreather mask. He had a marked leukocytosis. A DO NOT RESUSCITATE order was in place. He had a history of traumatic brain injury in the early 20s. There was also evidence of urinary tract infection. Despite treatment with antibiotic therapy he had progressive respiratory failure and ultimately expired. Comfort care measures were adopted and he was treated with a morphine drip. There was chest x-ray worsening consistent with his progressive respiratory failure. Family was present throughout his hospital care and was advised of his poor prognosis prior to his demise.   Discharge Instructions     Medication List    ASK your doctor about these medications        acetaminophen 325 MG tablet  Commonly known as:  TYLENOL  Take 650 mg by mouth every 4 (four) hours as needed for mild pain or fever (over 100).     DEPAKOTE SPRINKLES 125 MG capsule  Generic drug:  divalproex  Take 500 mg by mouth 2 (two) times daily. Give with applesauce and give by mouth every 12 hours for closed head injuries w/psychosis.     HCA SUPER THERAVITE-M PO  Take 1 tablet by mouth daily. For health maintenance     risperiDONE 0.5 MG tablet  Commonly known as:  RISPERDAL  Take 0.25 mg by mouth 2 (two) times daily. For psychosis     senna 8.6 MG tablet  Commonly known as:  SENOKOT  Take 1 tablet  by mouth daily.         Bayley Yarborough 04/06/2014

## 2014-04-30 NOTE — Progress Notes (Signed)
Subjective: Mr. [name] felt progressive respiratory distress yesterday necessitating the use of morphine for comfort. He is now on morphine drip at 3 mg per hour and is quite comfortable appearing. His heart rate has improved from the 160 range to 116. Blood pressure is normal. Oxygen saturation is in the mid 90s on 100% nonrebreather. Fever has improved from over 102.  Objective: Vital signs in last 24 hours: Filed Vitals:   04/26/2014 0400 04/08/2014 0500 04/05/2014 0600 04/17/2014 0700  BP:      Pulse: 113 112 116 118  Temp: 98.1 F (36.7 C)     TempSrc: Oral     Resp: Height:      Weight:      SpO2: 96% 95% 95% 95%   Weight change:   Intake/Output Summary (Last 24 hours) at 04/03/2014 0724 Last data filed at 04/24/2014 0500  Gross per 24 hour  Intake 701.38 ml  Output    675 ml  Net  26.38 ml    Physical Exam: He is not responsive now. He does not appear dyspneic now. Shallow breath sounds present. Heart was tachycardic. Abdomen is soft and nondistended.  Lab Results:   No results found for this or any previous visit (from the past 24 hour(s)).   ABGS No results for input(s): PHART, PO2ART, TCO2, HCO3 in the last 72 hours.  Invalid input(s): PCO2 CULTURES Recent Results (from the past 240 hour(s))  Culture, blood (routine x 2)     Status: None (Preliminary result)   Collection Time: 04-16-14  3:04 AM  Result Value Ref Range Status   Specimen Description BLOOD LEFT ANTECUBITAL  Final   Special Requests BOTTLES DRAWN AEROBIC ONLY 8CC  Final   Culture NO GROWTH 1 DAY  Final   Report Status PENDING  Incomplete  Culture, blood (routine x 2)     Status: None (Preliminary result)   Collection Time: 16-Apr-2014  3:10 AM  Result Value Ref Range Status   Specimen Description BLOOD LEFT ARM  Final   Special Requests BOTTLES DRAWN AEROBIC AND ANAEROBIC 6CC  Final   Culture NO GROWTH 1 DAY  Final   Report Status PENDING  Incomplete  Urine culture     Status: None  (Preliminary result)   Collection Time: 2014/04/16  3:24 AM  Result Value Ref Range Status   Specimen Description URINE, CATHETERIZED  Final   Special Requests NONE  Final   Colony Count   Final    >=100,000 COLONIES/ML Performed at [facility]    Culture   Final    GRAM NEGATIVE RODS Performed at [facility]    Report Status PENDING  Incomplete  MRSA PCR Screening     Status: None   Collection Time: 04-16-14  1:25 PM  Result Value Ref Range Status   MRSA by PCR NEGATIVE NEGATIVE Final    Comment:        The GeneXpert MRSA Assay (FDA approved for NASAL specimens only), is one component of a comprehensive MRSA colonization surveillance program. It is not intended to diagnose MRSA infection nor to guide or monitor treatment for MRSA infections.    Studies/Results: Dg Chest Port 1 View  03/29/2014   CLINICAL DATA:  Subsequent evaluation of 74 year old male with rest distress in right-sided pneumonia. Tachycardia.  EXAM: PORTABLE CHEST - 1 VIEW  COMPARISON:  Chest x-ray 03/26/2014.  FINDINGS: Complete opacification of the right hemithorax, increased compared to yesterday's examination. Elevated right hemidiaphragm. Moderate  to large left pleural effusion layering dependently. Probable passive subsegmental atelectasis in the left lower lobe. Cardiac silhouette is largely obscured. No evidence of pulmonary edema. The patient is rotated to the right on today's exam, resulting in distortion of the mediastinal contours and reduced diagnostic sensitivity and specificity for mediastinal pathology. Atherosclerotic calcifications in the thoracic aorta.  IMPRESSION: 1. Complete opacification of the right hemithorax new compared to the prior examination, concerning for widespread atelectasis from mucous plugging, although worsening findings may relate to airspace consolidation from pneumonia and/or large right pleural effusion. 2. Increasing moderate to large left pleural effusion  with passive atelectasis in the left lower lobe. 3. Atherosclerosis.   Electronically Signed   By: Trudie Reedaniel  Entrikin M.D.   On: 03/29/2014 14:16   Micro Results: Recent Results (from the past 240 hour(s))  Culture, blood (routine x 2)     Status: None (Preliminary result)   Collection Time: August 03, 2014  3:04 AM  Result Value Ref Range Status   Specimen Description BLOOD LEFT ANTECUBITAL  Final   Special Requests BOTTLES DRAWN AEROBIC ONLY 8CC  Final   Culture NO GROWTH 1 DAY  Final   Report Status PENDING  Incomplete  Culture, blood (routine x 2)     Status: None (Preliminary result)   Collection Time: August 03, 2014  3:10 AM  Result Value Ref Range Status   Specimen Description BLOOD LEFT ARM  Final   Special Requests BOTTLES DRAWN AEROBIC AND ANAEROBIC 6CC  Final   Culture NO GROWTH 1 DAY  Final   Report Status PENDING  Incomplete  Urine culture     Status: None (Preliminary result)   Collection Time: August 03, 2014  3:24 AM  Result Value Ref Range Status   Specimen Description URINE, CATHETERIZED  Final   Special Requests NONE  Final   Colony Count   Final    >=100,000 COLONIES/ML [facility]formed at [facility]    Culture   Final    GRAM NEGATIVE RODS [facility]formed at [facility]    Report Status PENDING  Incomplete  MRSA PCR Screening     Status: None   Collection Time: August 03, 2014  1:25 PM  Result Value Ref Range Status   MRSA by PCR NEGATIVE NEGATIVE Final    Comment:        The GeneXpert MRSA Assay (FDA approved for NASAL specimens only), is one component of a comprehensive MRSA colonization surveillance program. It is not intended to diagnose MRSA infection nor to guide or monitor treatment for MRSA infections.    Studies/Results: Dg Chest Port 1 View  03/29/2014   CLINICAL DATA:  Subsequent evaluation of 74 year old male with rest distress in right-sided pneumonia. Tachycardia.  EXAM: PORTABLE CHEST - 1 VIEW  COMPARISON:  Chest x-ray 03/26/2014.  FINDINGS: Complete  opacification of the right hemithorax, increased compared to yesterday's examination. Elevated right hemidiaphragm. Moderate to large left pleural effusion layering dependently. Probable passive subsegmental atelectasis in the left lower lobe. Cardiac silhouette is largely obscured. No evidence of pulmonary edema. The patient is rotated to the right on today's exam, resulting in distortion of the mediastinal contours and reduced diagnostic sensitivity and specificity for mediastinal pathology. Atherosclerotic calcifications in the thoracic aorta.  IMPRESSION: 1. Complete opacification of the right hemithorax new compared to the prior examination, concerning for widespread atelectasis from mucous plugging, although worsening findings may relate to airspace consolidation from pneumonia and/or large right pleural effusion. 2. Increasing moderate to large left pleural effusion with passive atelectasis in the  left lower lobe. 3. Atherosclerosis.   Electronically Signed   By: Trudie Reed M.D.   On: 03/29/2014 14:16   Medications:  I have reviewed the patient's current medications Scheduled Meds: . antiseptic oral rinse  7 mL Mouth Rinse BID  . aztreonam  1 g Intravenous 3 times per day  . enoxaparin (LOVENOX) injection  40 mg Subcutaneous Q24H  . levofloxacin (LEVAQUIN) IV  750 mg Intravenous Q24H  . sodium chloride  3 mL Intravenous Q12H  . vancomycin  1,000 mg Intravenous Q12H   Continuous Infusions: . dextrose 5 % and 0.45% NaCl 125 mL/hr at 03/29/14 1853  . morphine 3 mg/hr (04/02/14 0300)   PRN Meds:.acetaminophen **OR** acetaminophen, morphine injection, morphine   Assessment/Plan: #1. Healthcare associated pneumonia with respiratory failure. Continue comfort care with his morphine drip.  Repeat chest x-ray yesterday revealed complete opacification of the right hemithorax. Bilateral effusions are present. Prognosis is poor. Blood cultures negative so far. #2. UTI. Urine culture reveals  greater than 100,000 gram-negative rods. #3. History of traumatic brain injury. Active Problems:   HCAP (healthcare-associated pneumonia)   Anemia   Protein calorie malnutrition     LOS: 2 days   Equan Cogbill 04-02-14, 7:24 AM

## 2014-04-30 NOTE — Progress Notes (Signed)
Pt passed at 1859. Family came up to spend time with patient. Donor services was called again to let them know he had expired. Patient placement was notified. [facility]

## 2014-04-30 NOTE — Progress Notes (Signed)
SLP Cancellation Note  Patient Details Name: [name] MRN: [id number] DOB: 02/10/1940   Cancelled treatment:       Reason Eval/Treat Not Completed: Fatigue/lethargy limiting ability to participate;Other (comment) (pt on comfort care); Spoke with RN, no swallow evaluation needed at this time. Please reconsult should the need arise.  Thank you,  [name], [name] [phone]    [name] 04/06/2014, 12:35 PM

## 2014-04-30 DEATH — deceased

## 2015-06-23 IMAGING — CR DG CHEST 1V PORT
1 series · 2 of 2 positions shown · non-contrast
Comparison: Chest x-ray 03/26/2014.

CLINICAL DATA: Subsequent evaluation of 73-year-old male with rest
distress in right-sided pneumonia. Tachycardia.

EXAM:
PORTABLE CHEST - 1 VIEW

[Series 1: portable · 0.17mm/px · 2 of 2 slices shown]
[im 1/2]
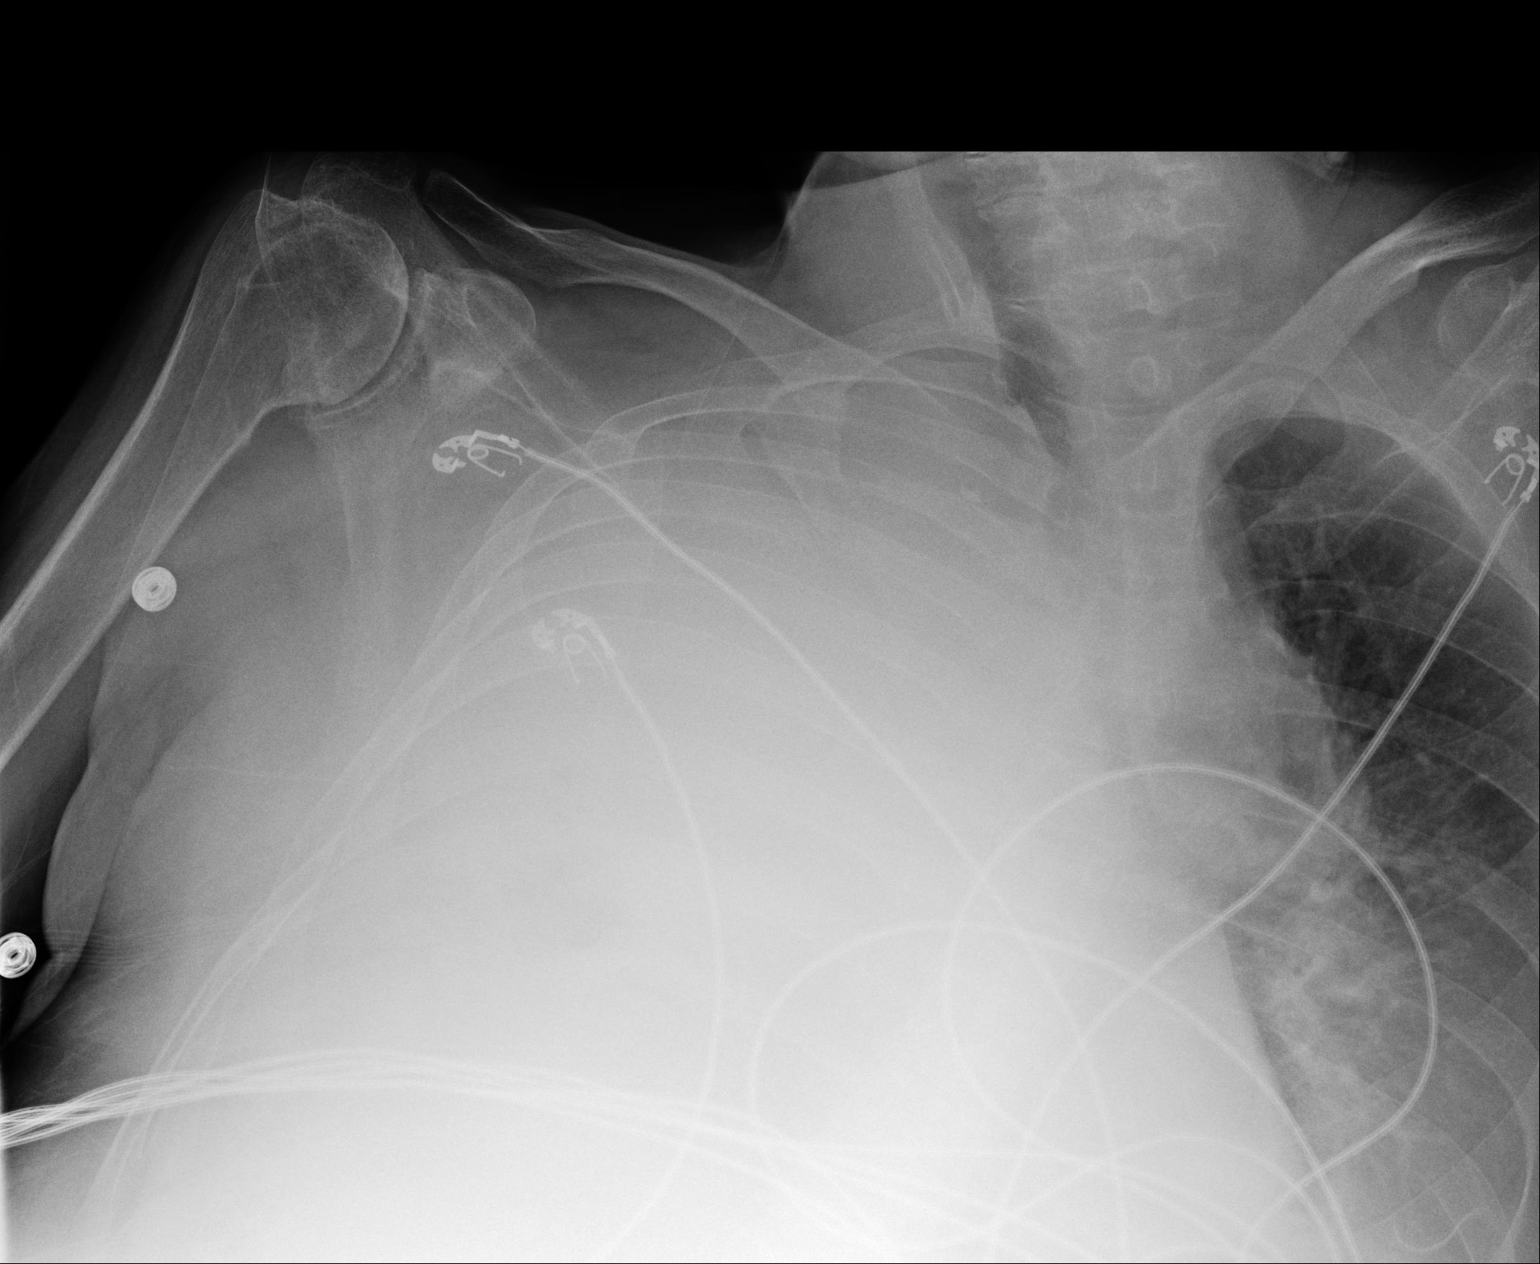
[im 2/2]
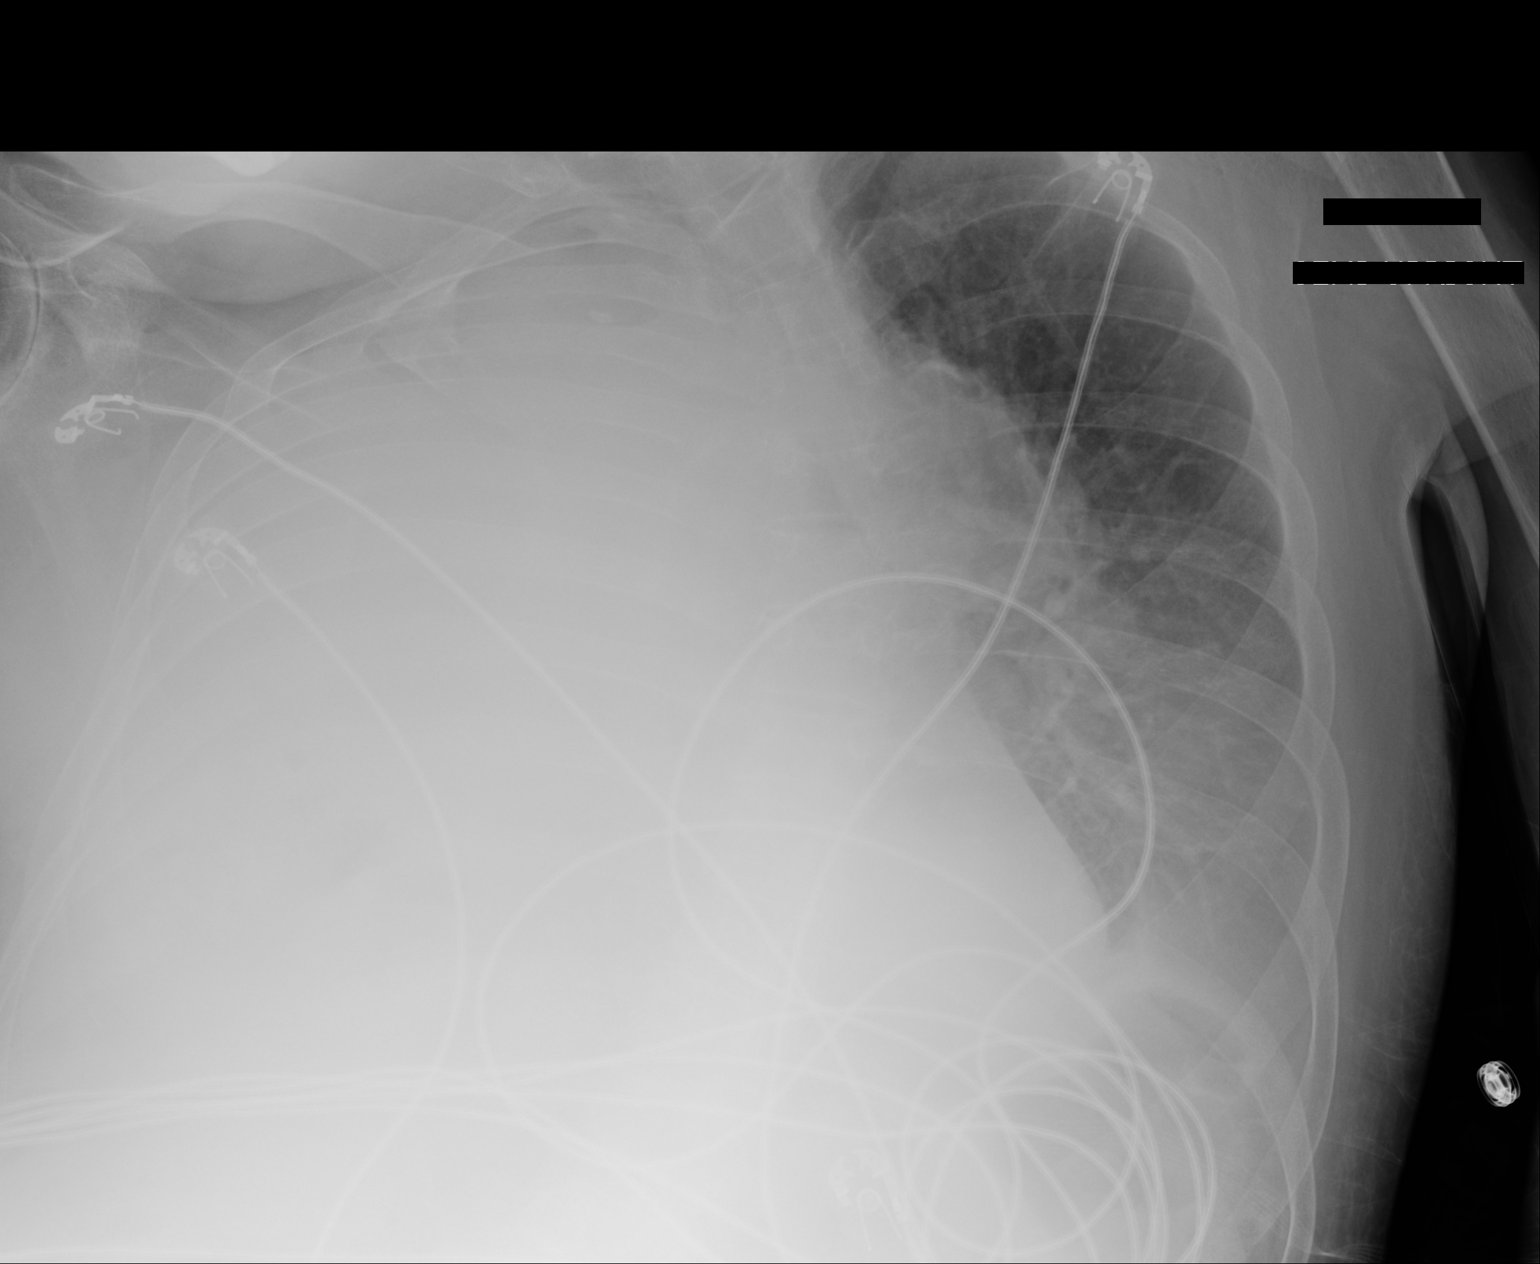

[2 of 2 positions shown; findings below may reference images not displayed]

FINDINGS: Complete opacification of the right hemithorax, increased compared
to yesterday's examination. Elevated right hemidiaphragm. Moderate
to large left pleural effusion layering dependently. Probable
passive subsegmental atelectasis in the left lower lobe. Cardiac
silhouette is largely obscured. No evidence of pulmonary edema. The
patient is rotated to the right on today's exam, resulting in
distortion of the mediastinal contours and reduced diagnostic
sensitivity and specificity for mediastinal pathology.
Atherosclerotic calcifications in the thoracic aorta.
IMPRESSION: 1. Complete opacification of the right hemithorax new compared to
the prior examination, concerning for widespread atelectasis from
mucous plugging, although worsening findings may relate to airspace
consolidation from pneumonia and/or large right pleural effusion.
2. Increasing moderate to large left pleural effusion with passive
atelectasis in the left lower lobe.
3. Atherosclerosis.
